# Patient Record
Sex: Male | Born: 1980 | Race: White | Hispanic: No | Marital: Single | Smoking: Current every day smoker
Health system: Southern US, Community
[De-identification: ages and names within clinical notes are randomized; demographics above are authoritative.]

## PROBLEM LIST (undated history)

## (undated) DIAGNOSIS — Z79891 Long term (current) use of opiate analgesic: Secondary | ICD-10-CM

## (undated) DIAGNOSIS — F1010 Alcohol abuse, uncomplicated: Secondary | ICD-10-CM

## (undated) DIAGNOSIS — F1910 Other psychoactive substance abuse, uncomplicated: Secondary | ICD-10-CM

## (undated) DIAGNOSIS — Z5181 Encounter for therapeutic drug level monitoring: Secondary | ICD-10-CM

## (undated) DIAGNOSIS — F1111 Opioid abuse, in remission: Secondary | ICD-10-CM

## (undated) HISTORY — PX: APPENDECTOMY: SHX54

## (undated) HISTORY — PX: CERVICAL FUSION: SHX112

---

## 2004-09-24 ENCOUNTER — Emergency Department: Payer: Self-pay | Admitting: Unknown Physician Specialty

## 2004-10-13 ENCOUNTER — Emergency Department: Payer: Self-pay | Admitting: Emergency Medicine

## 2005-01-31 ENCOUNTER — Observation Stay: Payer: Self-pay | Admitting: General Surgery

## 2005-05-06 ENCOUNTER — Emergency Department: Payer: Self-pay | Admitting: Emergency Medicine

## 2005-06-13 ENCOUNTER — Emergency Department: Payer: Self-pay | Admitting: Emergency Medicine

## 2005-06-18 ENCOUNTER — Emergency Department: Payer: Self-pay | Admitting: Emergency Medicine

## 2005-06-18 IMAGING — CR RIGHT ANKLE - COMPLETE 3+ VIEW
1 series · 5 of 5 positions shown · non-contrast
Comparison: none

REASON FOR EXAM: Twisted ankle while running
COMMENTS:

PROCEDURE:     DXR - DXR ANKLE RIGHT COMPLETE  - [DATE]  [DATE]
RESULT:          Multiple views reveals no fractures or dislocations.  The
ankle mortise is intact.

[Series 1: view not recorded · 0.17mm/px · 5 of 5 slices shown]
[im 1/5]
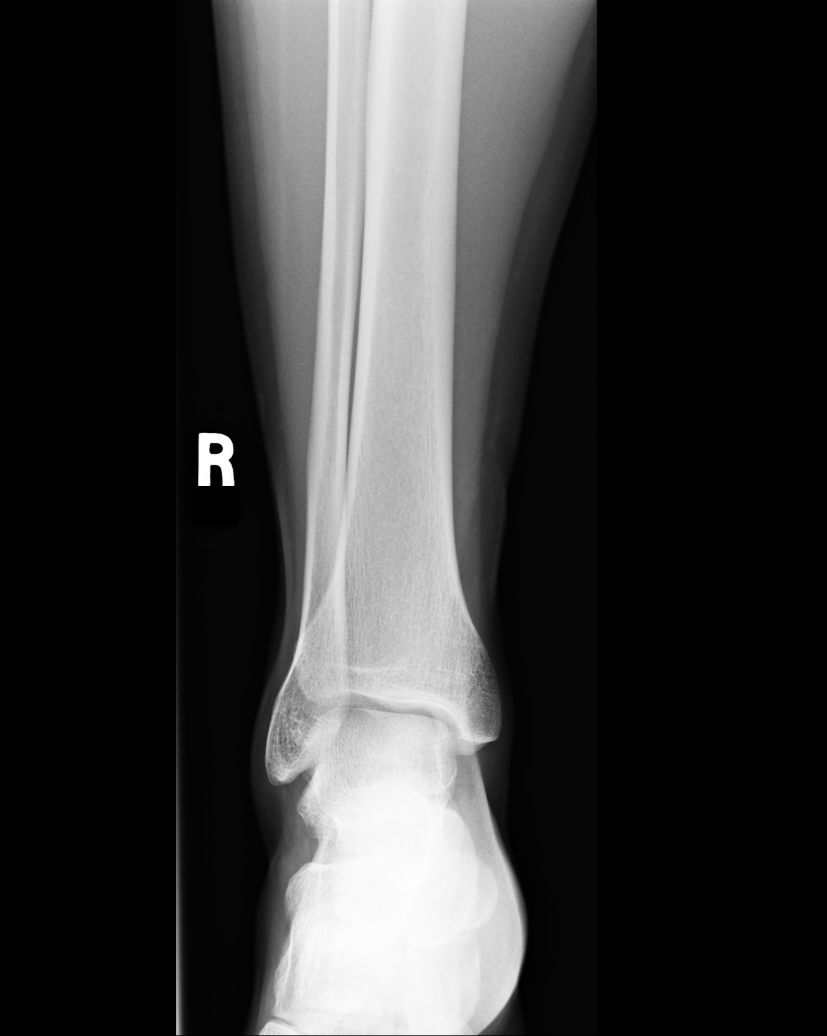
[im 2/5]
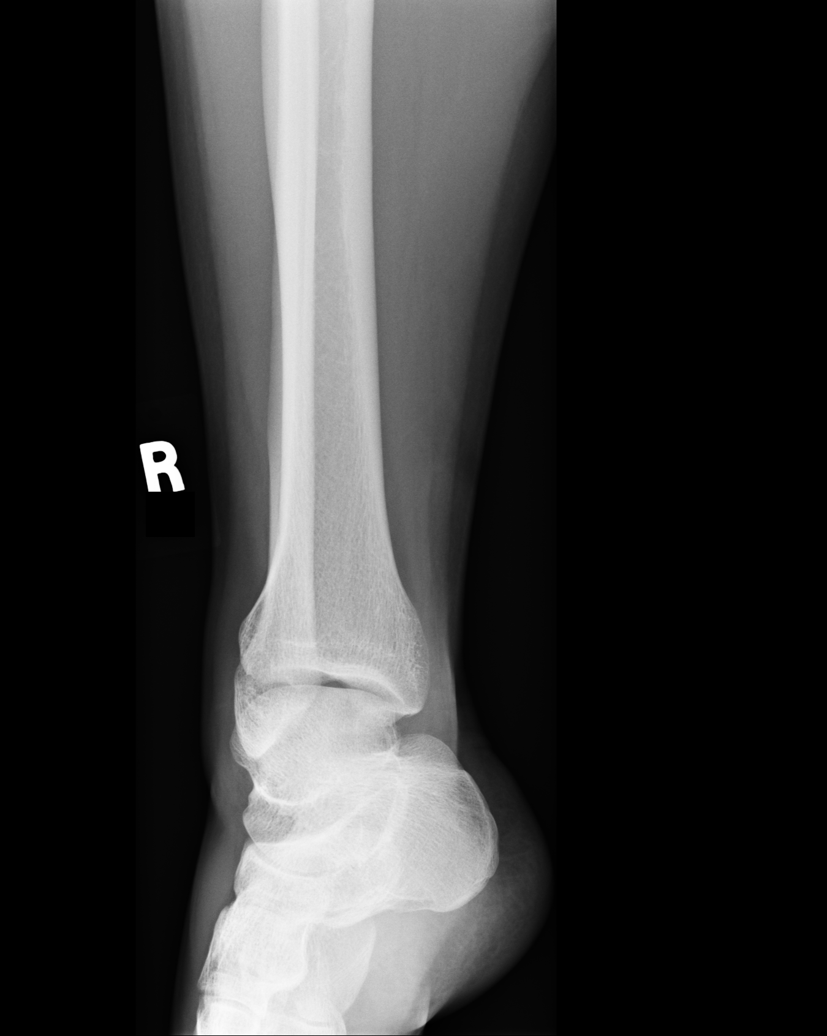
[im 3/5]
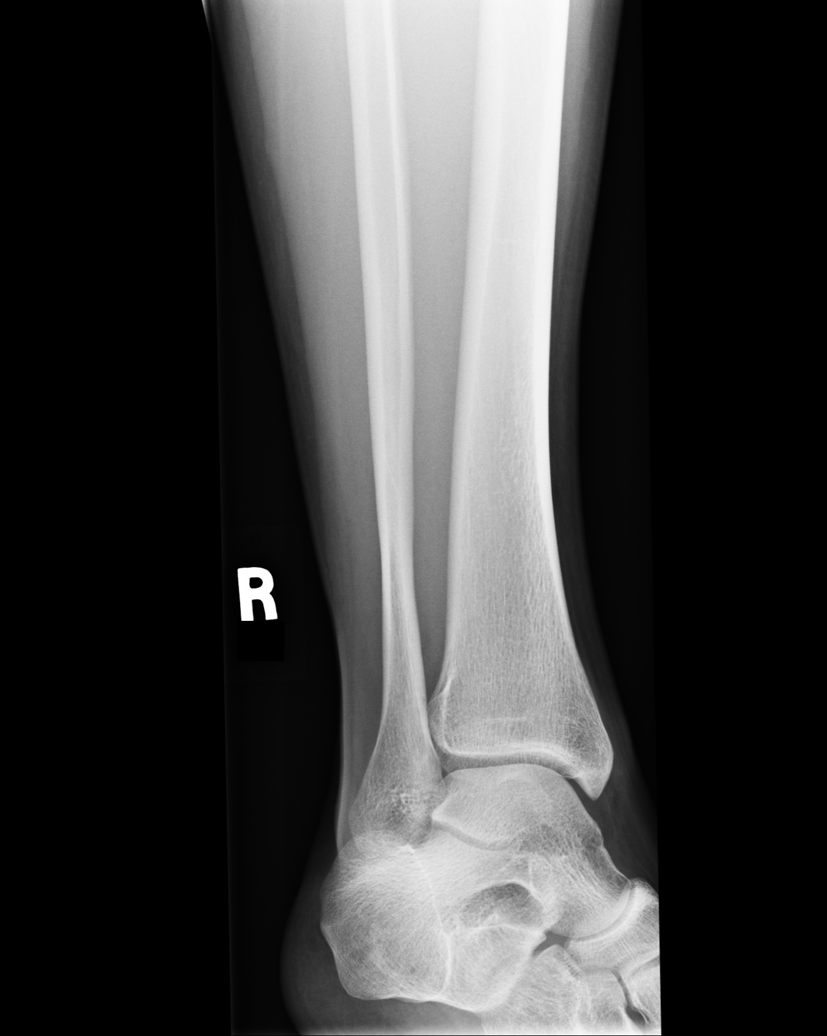
[im 4/5]
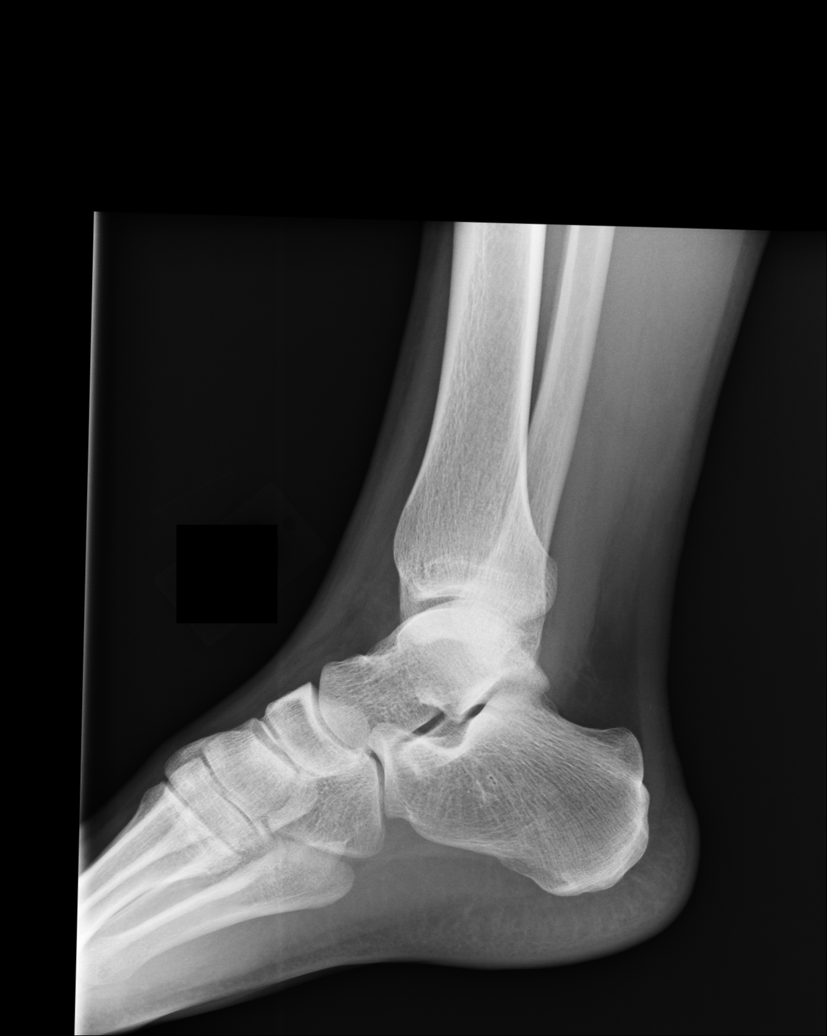
[im 5/5]
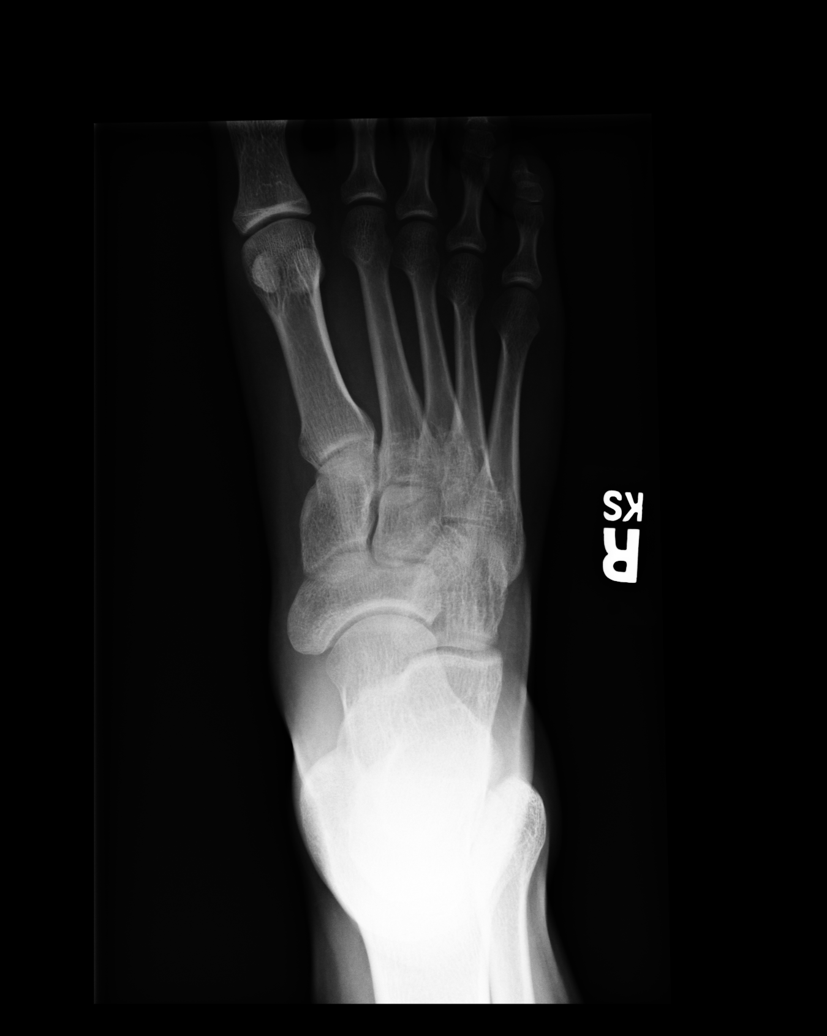

[5 of 5 positions shown; findings below may reference images not displayed]

IMPRESSION: No acute fracture is seen of the RIGHT ankle.

## 2005-06-20 ENCOUNTER — Emergency Department: Payer: Self-pay | Admitting: Emergency Medicine

## 2005-07-30 ENCOUNTER — Emergency Department: Payer: Self-pay | Admitting: Emergency Medicine

## 2005-08-02 ENCOUNTER — Emergency Department: Payer: Self-pay | Admitting: Emergency Medicine

## 2005-08-02 IMAGING — CR DG LUMBAR SPINE AP/LAT/OBLIQUES W/ FLEX AND EXT
1 series · 5 of 5 positions shown · non-contrast
Comparison: none

REASON FOR EXAM: FALL
COMMENTS:

[Series 1: view not recorded · 0.17mm/px · 5 of 5 slices shown]
[im 1/5]
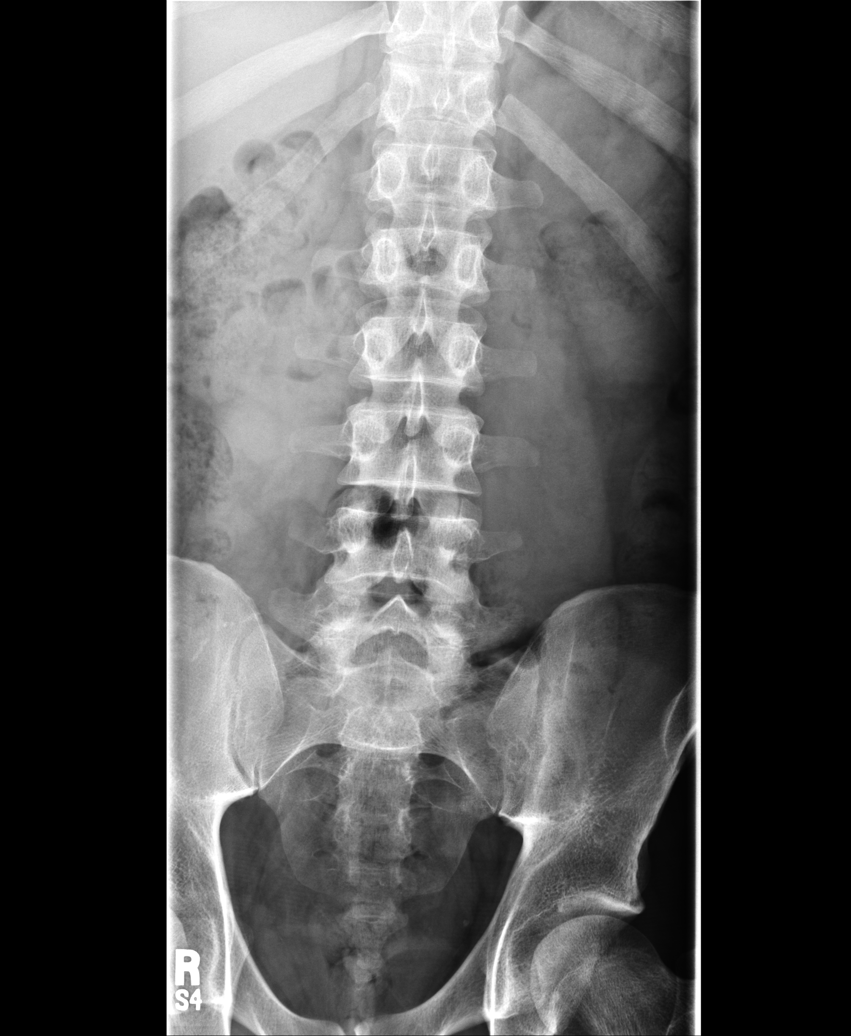
[im 2/5]
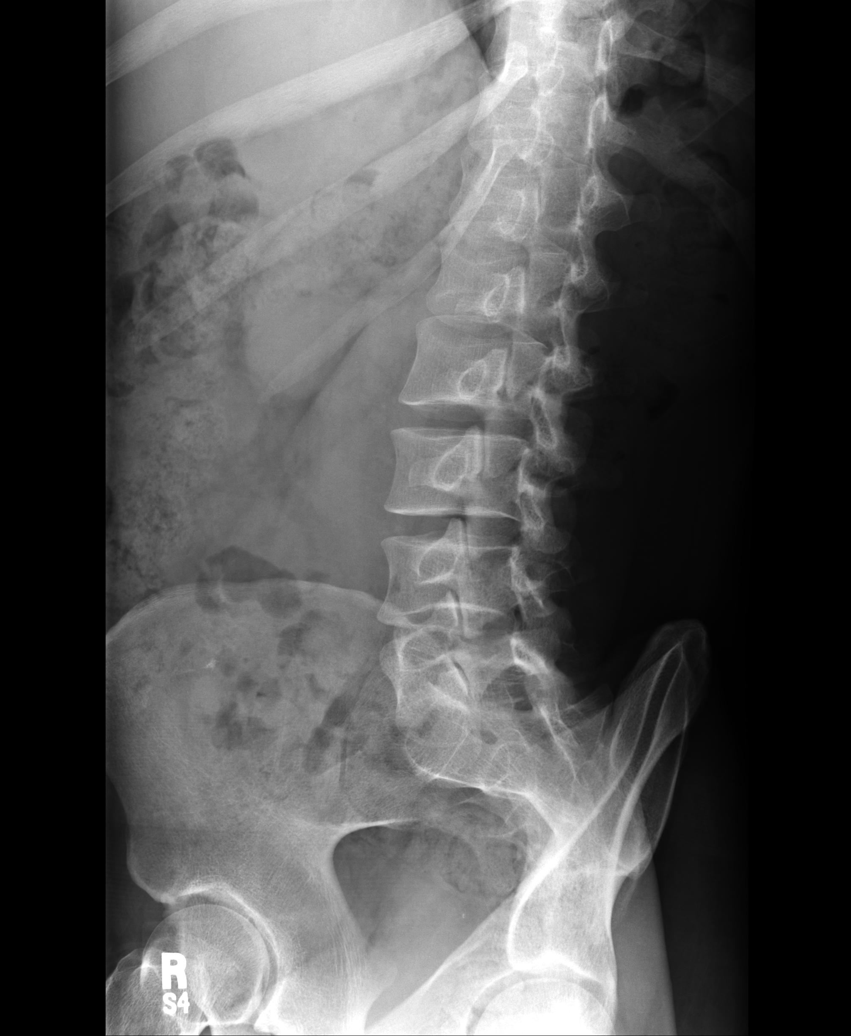
[im 3/5]
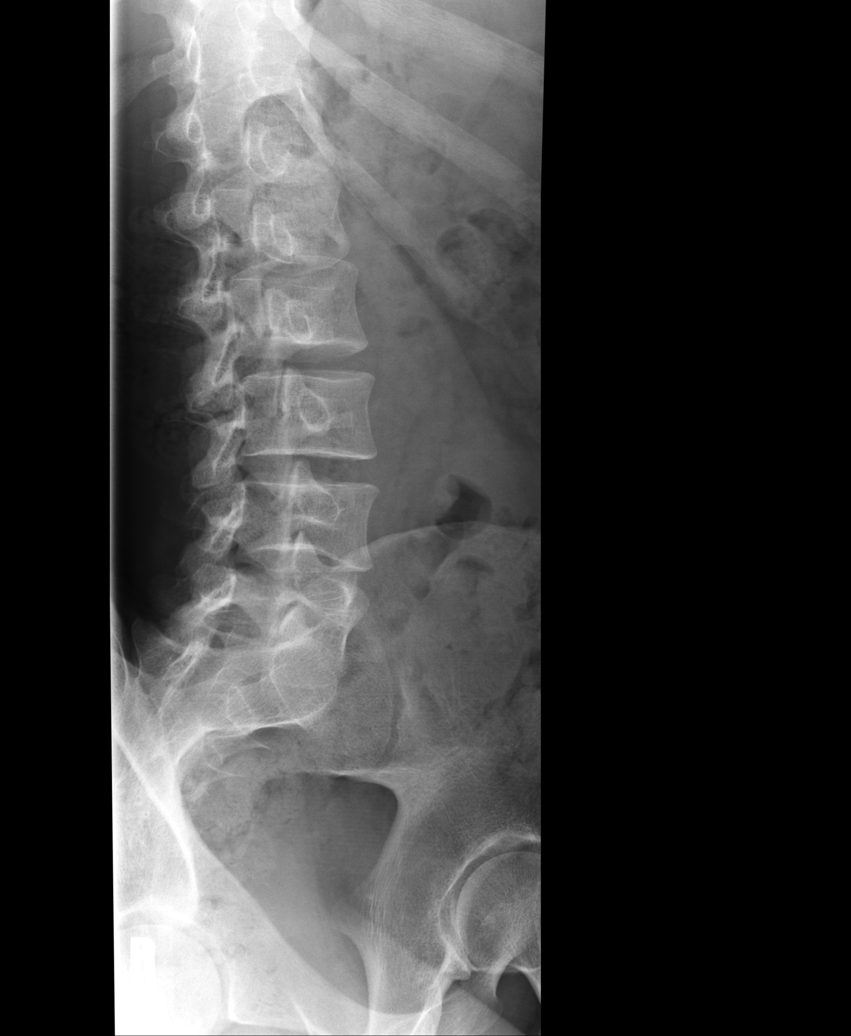
[im 4/5]
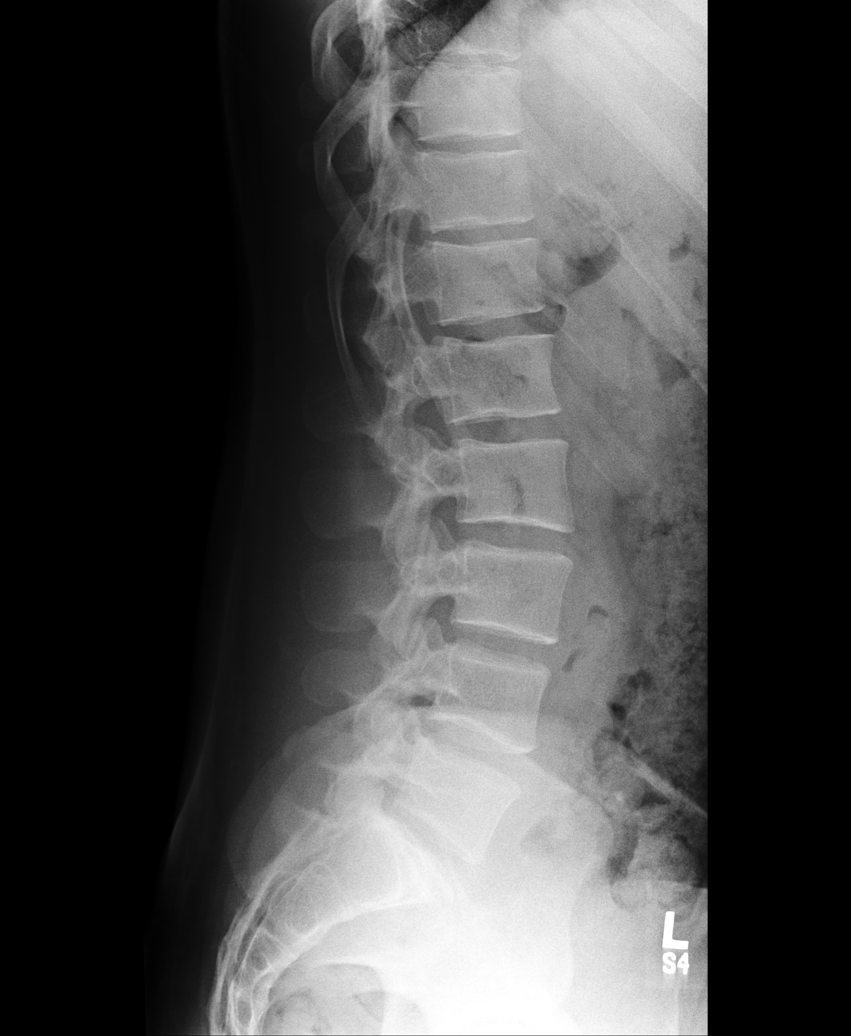
[im 5/5]
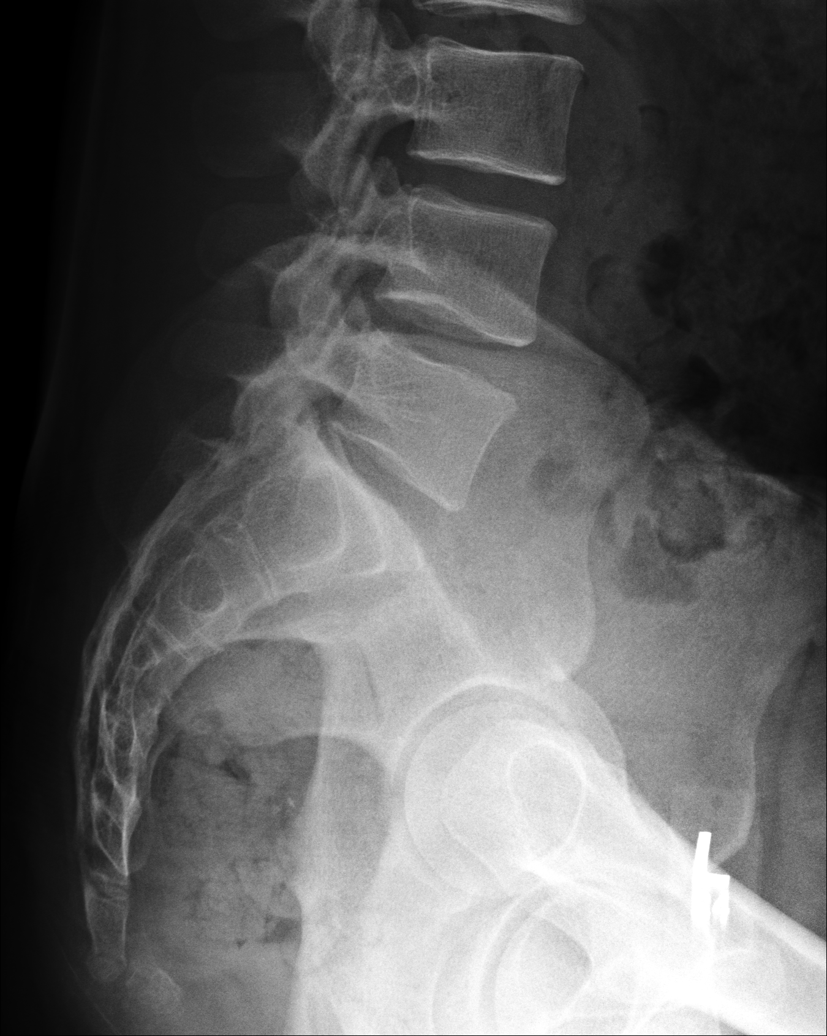

[5 of 5 positions shown; findings below may reference images not displayed]

PROCEDURE:     DXR - DXR LUMBAR SPINE WITH OBLIQUES  - [DATE]  [DATE]

RESULT:     AP, lateral and oblique views of the lumbar spine show the
vertebral body heights and intervertebral disc spaces to be well maintained.
Vertebral body alignment is normal. The pedicles are bilaterally intact.

Incidental note is made that six lumbar vertebrae are visualized consistent
with the absence of a rib at T12 or with lumbarization of S1.
IMPRESSION: No acute changes are identified.

## 2005-08-04 ENCOUNTER — Inpatient Hospital Stay: Payer: Self-pay | Admitting: Internal Medicine

## 2005-08-04 ENCOUNTER — Other Ambulatory Visit: Payer: Self-pay

## 2005-08-05 ENCOUNTER — Inpatient Hospital Stay: Payer: Self-pay

## 2005-11-09 ENCOUNTER — Emergency Department: Payer: Self-pay | Admitting: Emergency Medicine

## 2005-11-09 IMAGING — CR RIGHT HAND - COMPLETE 3+ VIEW
1 series · 3 of 3 positions shown · non-contrast
Comparison: none

REASON FOR EXAM: Hit car with right hand, swollen and deformed
COMMENTS:  LMP: (Male)

PROCEDURE:     DXR - DXR HAND RT COMPLETE W/OBLIQUES  - [DATE]  [DATE]
RESULT:     The bones of the hand are adequately mineralized. I do not see
evidence of an acute fracture. The overlying soft tissues are normal in
appearance.

[Series 1: view not recorded · 0.17mm/px · 3 of 3 slices shown]
[im 1/3]
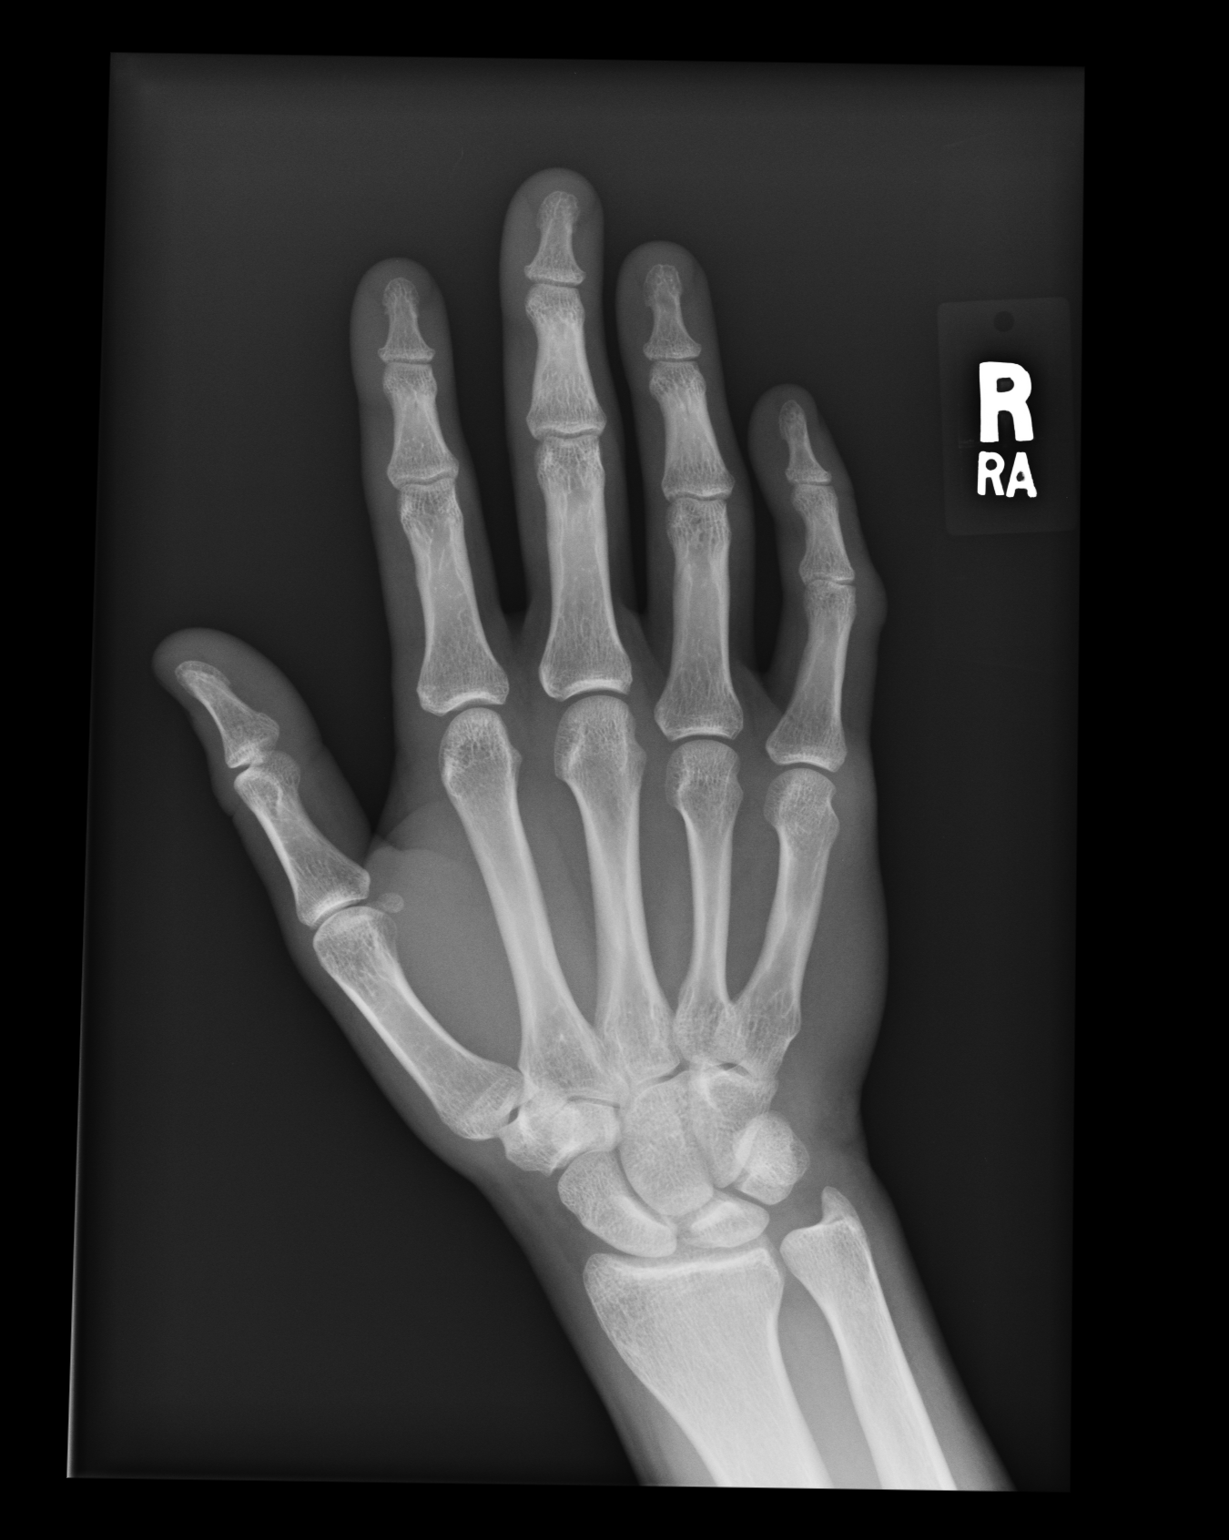
[im 2/3]
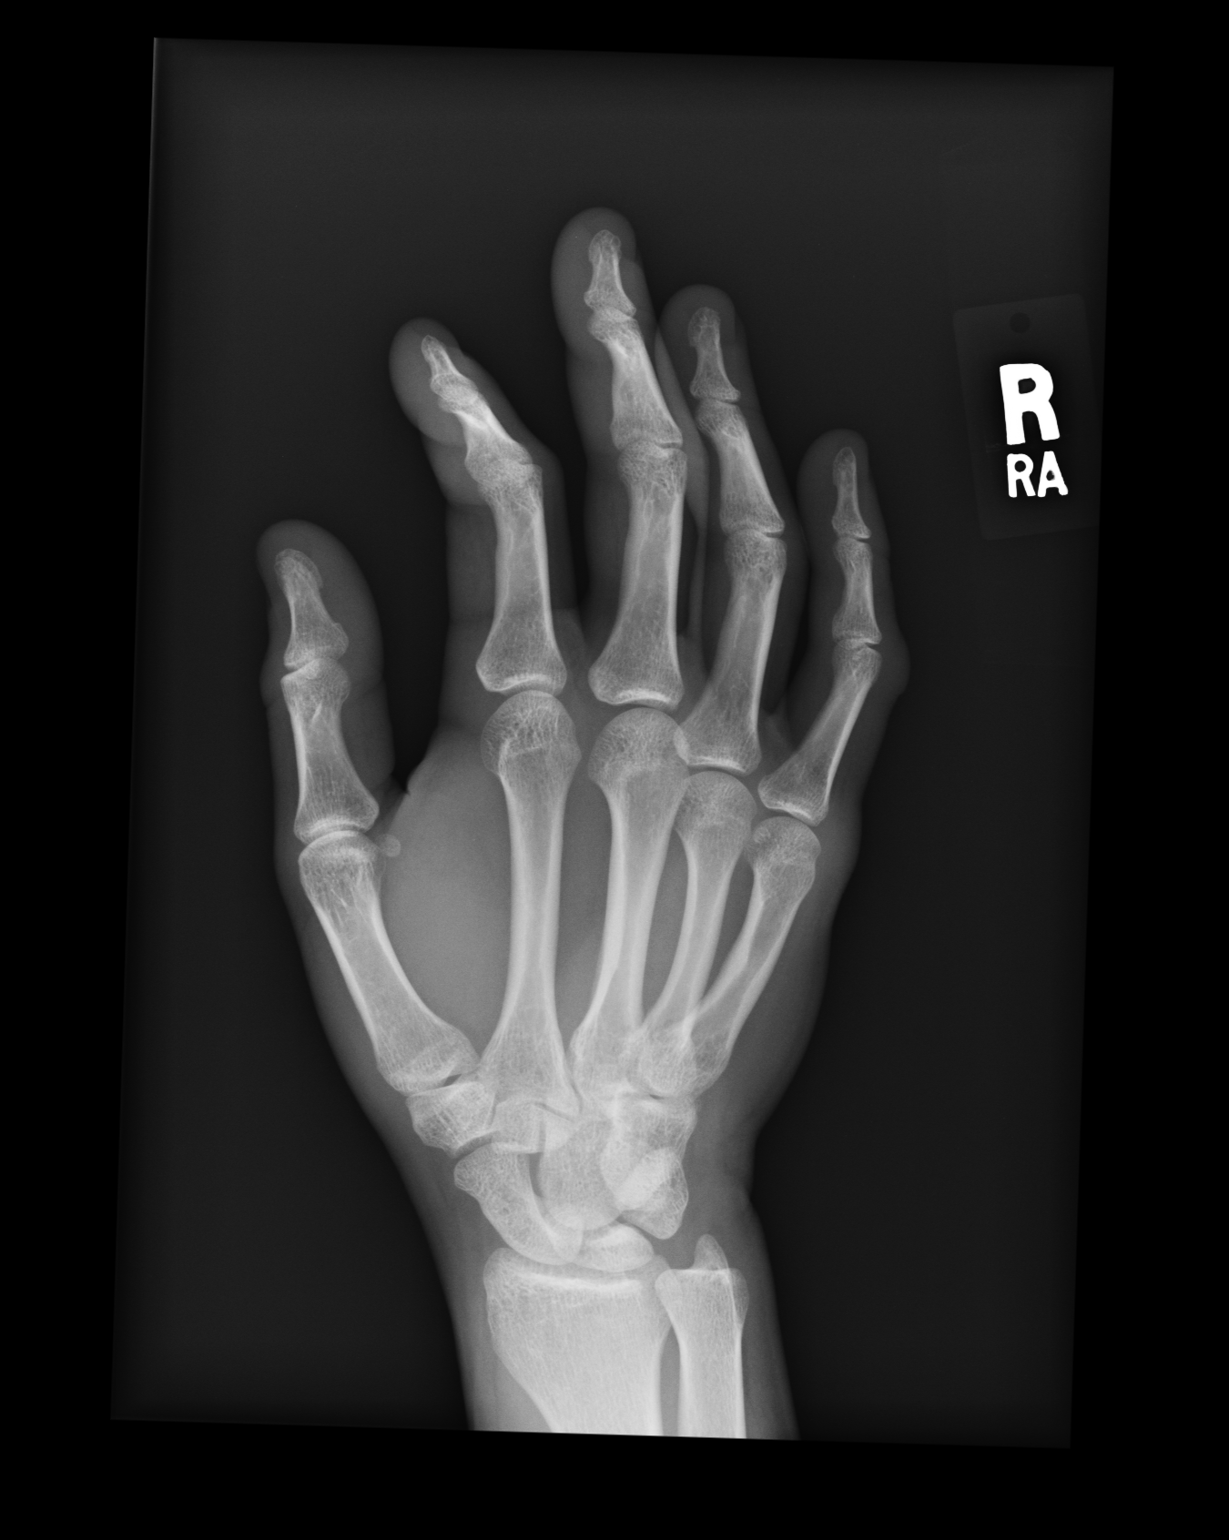
[im 3/3]
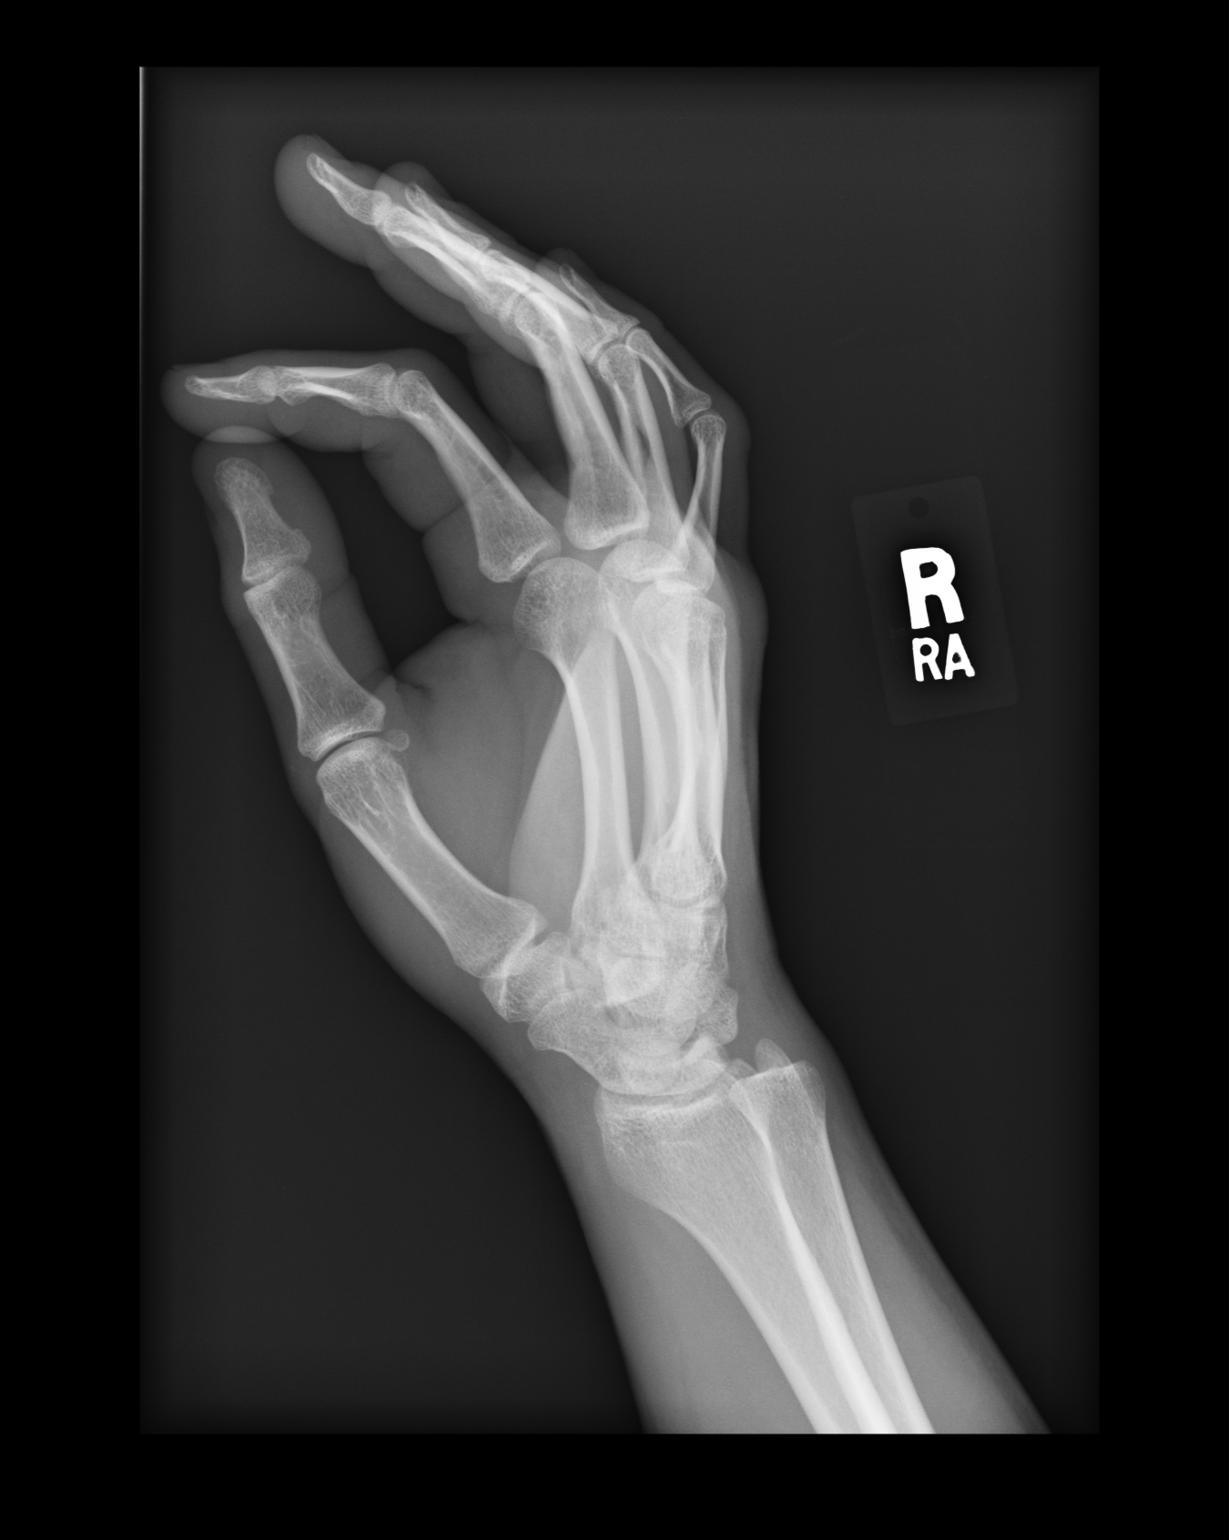

[3 of 3 positions shown; findings below may reference images not displayed]

IMPRESSION: I see no acute fracture of the bones of the RIGHT hand.
Follow-up films are recommended if any area of symptoms persist.

## 2006-02-15 ENCOUNTER — Emergency Department: Payer: Self-pay | Admitting: Unknown Physician Specialty

## 2006-02-20 ENCOUNTER — Emergency Department: Payer: Self-pay | Admitting: Emergency Medicine

## 2007-07-15 ENCOUNTER — Emergency Department: Payer: Self-pay | Admitting: Emergency Medicine

## 2007-09-22 ENCOUNTER — Emergency Department: Payer: Self-pay | Admitting: Emergency Medicine

## 2007-12-09 ENCOUNTER — Emergency Department: Payer: Self-pay | Admitting: Emergency Medicine

## 2007-12-29 ENCOUNTER — Emergency Department: Payer: Self-pay | Admitting: Emergency Medicine

## 2007-12-31 ENCOUNTER — Emergency Department: Payer: Self-pay | Admitting: Emergency Medicine

## 2008-03-25 ENCOUNTER — Emergency Department: Payer: Self-pay | Admitting: Emergency Medicine

## 2008-07-21 ENCOUNTER — Emergency Department: Payer: Self-pay | Admitting: Emergency Medicine

## 2010-04-04 ENCOUNTER — Emergency Department: Payer: Self-pay | Admitting: Emergency Medicine

## 2010-05-31 ENCOUNTER — Inpatient Hospital Stay: Payer: Self-pay | Admitting: Psychiatry

## 2012-04-04 ENCOUNTER — Emergency Department: Payer: Self-pay | Admitting: Emergency Medicine

## 2012-12-09 ENCOUNTER — Emergency Department: Payer: Self-pay | Admitting: Emergency Medicine

## 2012-12-09 LAB — URINALYSIS, COMPLETE
Bacteria: NONE SEEN
Bilirubin,UR: NEGATIVE
Blood: NEGATIVE
Ketone: NEGATIVE
Leukocyte Esterase: NEGATIVE
Specific Gravity: 1.016 (ref 1.003–1.030)
Squamous Epithelial: NONE SEEN

## 2012-12-09 LAB — COMPREHENSIVE METABOLIC PANEL
Alkaline Phosphatase: 143 U/L — ABNORMAL HIGH (ref 50–136)
BUN: 13 mg/dL (ref 7–18)
Calcium, Total: 9.4 mg/dL (ref 8.5–10.1)
Creatinine: 1.16 mg/dL (ref 0.60–1.30)
EGFR (African American): >60
EGFR (Non-African Amer.): >60
Glucose: 106 mg/dL — ABNORMAL HIGH (ref 65–99)
Potassium: 3.8 mmol/L (ref 3.5–5.1)
Sodium: 141 mmol/L (ref 136–145)
Total Protein: 8.1 g/dL (ref 6.4–8.2)

## 2012-12-09 LAB — CBC
HCT: 44.8 % (ref 40.0–52.0)
HGB: 15.3 g/dL (ref 13.0–18.0)
MCV: 89 fL (ref 80–100)
Platelet: 293 10*3/uL (ref 150–440)
RBC: 5.04 10*6/uL (ref 4.40–5.90)
RDW: 13.7 % (ref 11.5–14.5)
WBC: 13.7 10*3/uL — ABNORMAL HIGH (ref 3.8–10.6)

## 2013-02-23 ENCOUNTER — Emergency Department: Payer: Self-pay | Admitting: Emergency Medicine

## 2013-07-07 ENCOUNTER — Emergency Department: Payer: Self-pay | Admitting: Emergency Medicine

## 2013-07-07 LAB — COMPREHENSIVE METABOLIC PANEL
ALK PHOS: 99 U/L
ALT: 22 U/L (ref 12–78)
Albumin: 3.7 g/dL (ref 3.4–5.0)
Anion Gap: 9 (ref 7–16)
BUN: 11 mg/dL (ref 7–18)
Bilirubin,Total: 0.4 mg/dL (ref 0.2–1.0)
CALCIUM: 8.6 mg/dL (ref 8.5–10.1)
CO2: 21 mmol/L (ref 21–32)
Chloride: 105 mmol/L (ref 98–107)
Creatinine: 0.99 mg/dL (ref 0.60–1.30)
EGFR (African American): >60
EGFR (Non-African Amer.): >60
Glucose: 81 mg/dL (ref 65–99)
Osmolality: 269 (ref 275–301)
Potassium: 3.7 mmol/L (ref 3.5–5.1)
SGOT(AST): 39 U/L — ABNORMAL HIGH (ref 15–37)
SODIUM: 135 mmol/L — AB (ref 136–145)
Total Protein: 7.9 g/dL (ref 6.4–8.2)

## 2013-07-07 LAB — CBC
HCT: 41.3 % (ref 40.0–52.0)
HGB: 13.8 g/dL (ref 13.0–18.0)
MCH: 30.1 pg (ref 26.0–34.0)
MCHC: 33.3 g/dL (ref 32.0–36.0)
MCV: 91 fL (ref 80–100)
PLATELETS: 257 10*3/uL (ref 150–440)
RBC: 4.57 10*6/uL (ref 4.40–5.90)
RDW: 13.9 % (ref 11.5–14.5)
WBC: 8.2 10*3/uL (ref 3.8–10.6)

## 2013-07-07 LAB — ETHANOL
ETHANOL %: 0.008 % (ref 0.000–0.080)
Ethanol %: 0.181 % — ABNORMAL HIGH (ref 0.000–0.080)
Ethanol: 181 mg/dL
Ethanol: 8 mg/dL

## 2013-07-07 LAB — URINALYSIS, COMPLETE
BACTERIA: NONE SEEN
BILIRUBIN, UR: NEGATIVE
Blood: NEGATIVE
GLUCOSE, UR: NEGATIVE mg/dL (ref 0–75)
Ketone: NEGATIVE
Leukocyte Esterase: NEGATIVE
Nitrite: NEGATIVE
Ph: 5 (ref 4.5–8.0)
Protein: NEGATIVE
RBC, UR: NONE SEEN /HPF (ref 0–5)
SQUAMOUS EPITHELIAL: NONE SEEN
Specific Gravity: 1.004 (ref 1.003–1.030)
WBC UR: NONE SEEN /HPF (ref 0–5)

## 2013-07-07 LAB — DRUG SCREEN, URINE
Amphetamines, Ur Screen: NEGATIVE (ref ?–1000)
BENZODIAZEPINE, UR SCRN: NEGATIVE (ref ?–200)
Barbiturates, Ur Screen: NEGATIVE (ref ?–200)
COCAINE METABOLITE, UR ~~LOC~~: POSITIVE (ref ?–300)
Cannabinoid 50 Ng, Ur ~~LOC~~: POSITIVE (ref ?–50)
MDMA (Ecstasy)Ur Screen: NEGATIVE (ref ?–500)
METHADONE, UR SCREEN: NEGATIVE (ref ?–300)
Opiate, Ur Screen: NEGATIVE (ref ?–300)
PHENCYCLIDINE (PCP) UR S: NEGATIVE (ref ?–25)
TRICYCLIC, UR SCREEN: NEGATIVE (ref ?–1000)

## 2013-07-07 LAB — TROPONIN I
Troponin-I: <0.02 ng/mL
Troponin-I: <0.02 ng/mL

## 2013-07-07 LAB — SALICYLATE LEVEL: SALICYLATES, SERUM: 3.5 mg/dL — AB

## 2013-07-07 LAB — ACETAMINOPHEN LEVEL: Acetaminophen: <2 ug/mL

## 2013-07-07 IMAGING — CR DG CHEST 2V
1 series · 2 of 2 positions shown · non-contrast
Comparison: None available for comparison at time of study
interpretation.

CLINICAL DATA: Shortness of breath, sternal chest pain, lethargy.

EXAM:
CHEST  2 VIEW

[Series 1: w chest pa · 0.14mm/px · 2 of 2 slices shown]
[im 1/2]
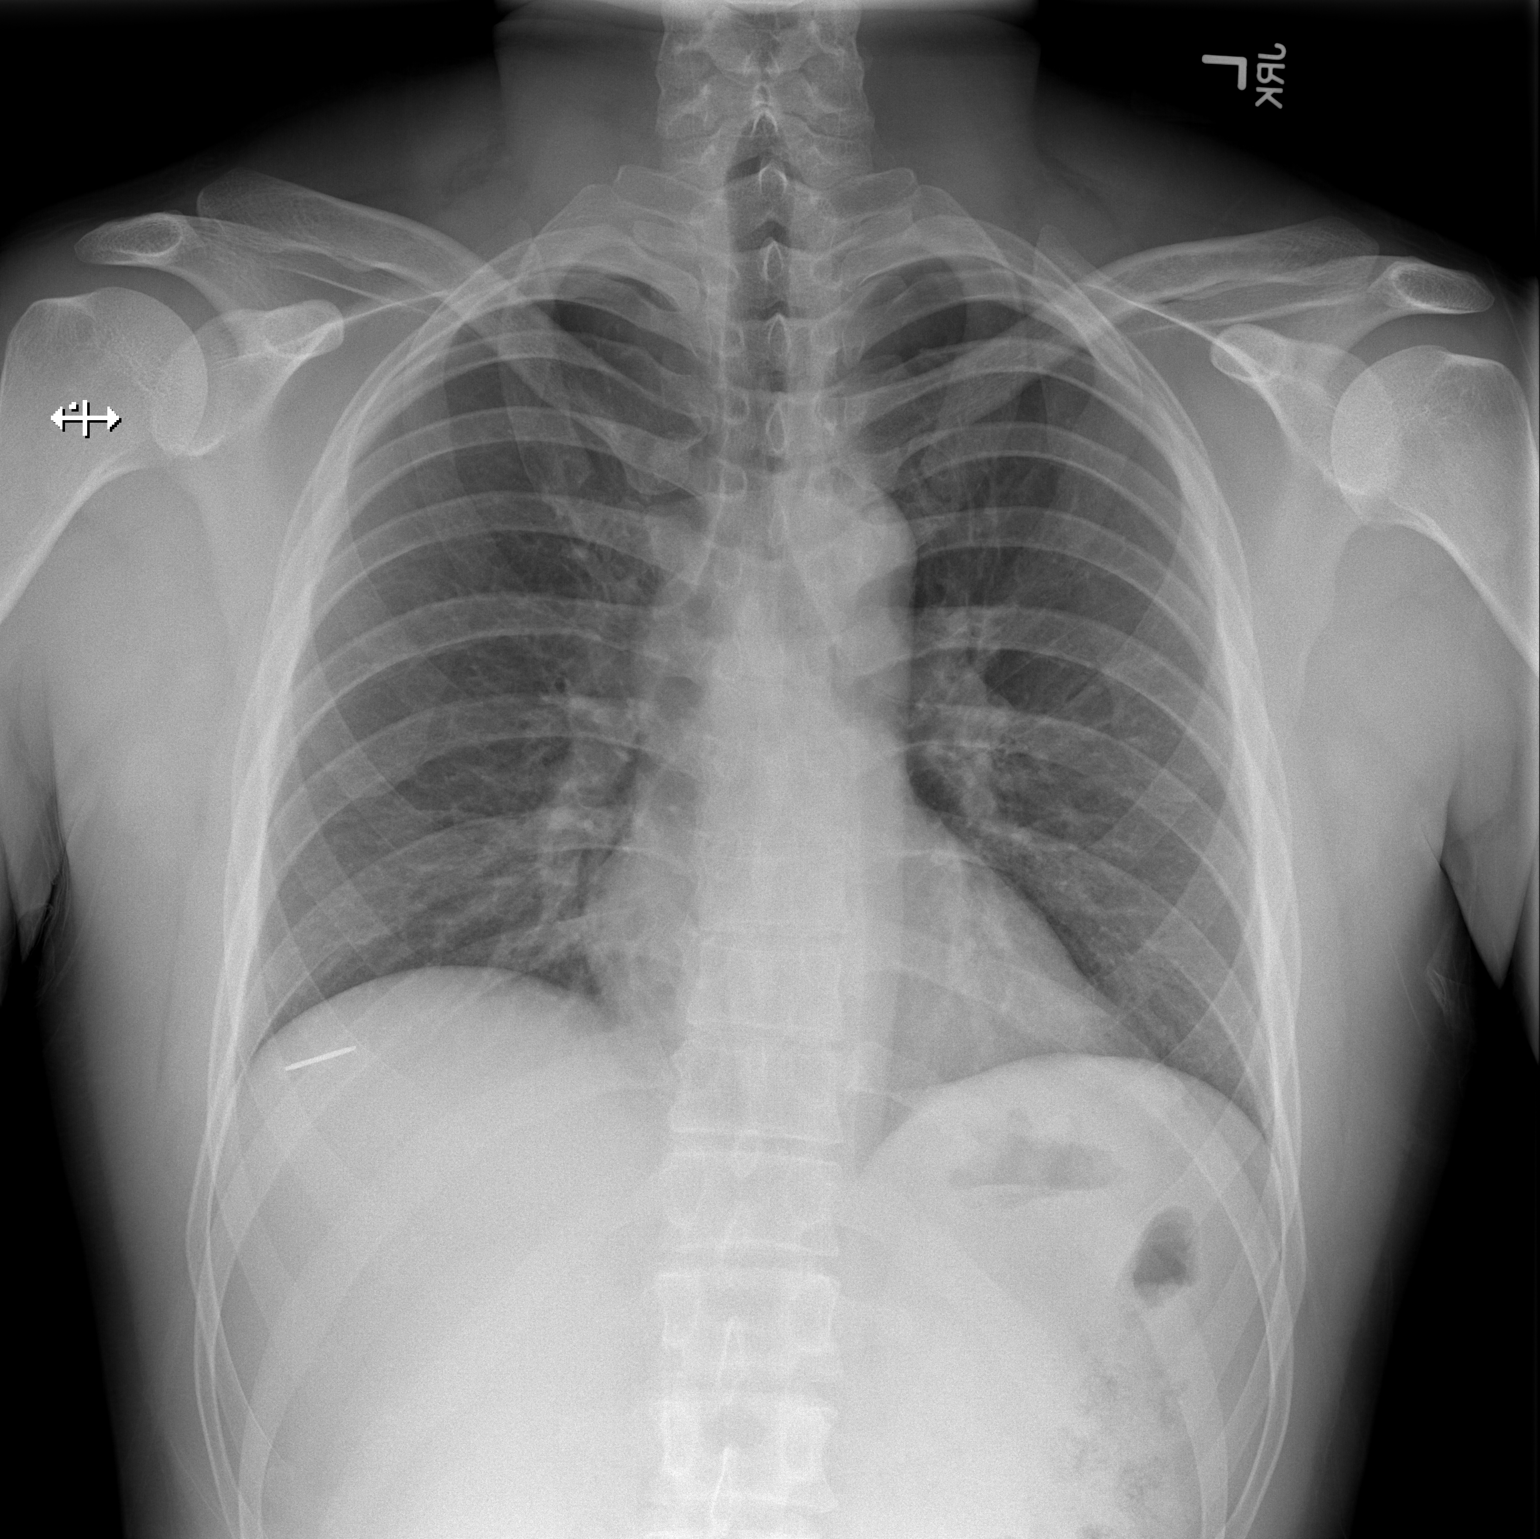
[im 2/2]
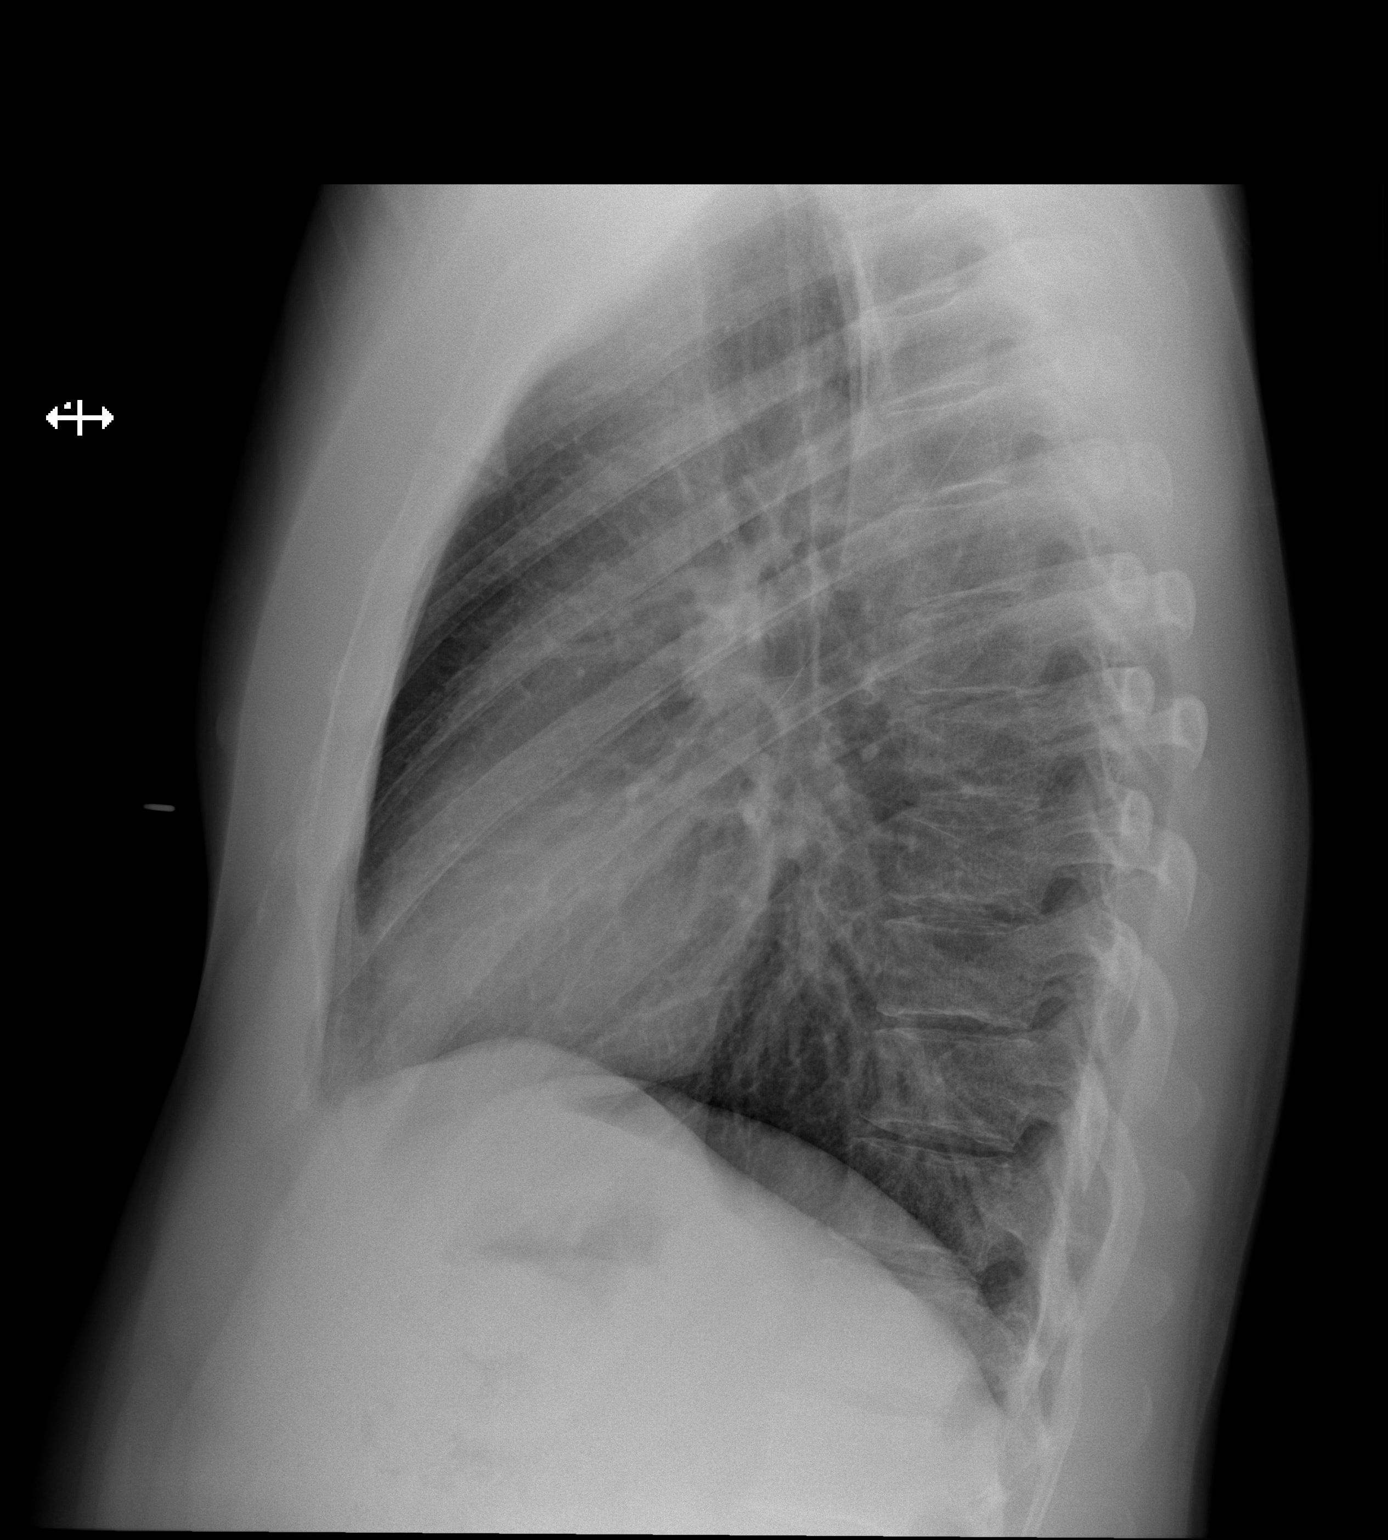

[2 of 2 positions shown; findings below may reference images not displayed]

FINDINGS: Cardiomediastinal silhouette is unremarkable. The lungs are clear
without pleural effusions or focal consolidations. Trachea projects
midline and there is no pneumothorax. Soft tissue planes and
included osseous structures are non-suspicious.
IMPRESSION: No acute cardiopulmonary process: Normal chest radiograph.

  By: HERCULES

## 2013-10-02 ENCOUNTER — Emergency Department: Payer: Self-pay | Admitting: Emergency Medicine

## 2014-01-25 ENCOUNTER — Emergency Department: Payer: Self-pay | Admitting: Emergency Medicine

## 2014-01-26 LAB — URINALYSIS, COMPLETE
BILIRUBIN, UR: NEGATIVE
Bacteria: NONE SEEN
Blood: NEGATIVE
Glucose,UR: NEGATIVE mg/dL (ref 0–75)
Ketone: NEGATIVE
LEUKOCYTE ESTERASE: NEGATIVE
Nitrite: NEGATIVE
PROTEIN: NEGATIVE
Ph: 6 (ref 4.5–8.0)
RBC,UR: NONE SEEN /HPF (ref 0–5)
SPECIFIC GRAVITY: 1.001 (ref 1.003–1.030)
SQUAMOUS EPITHELIAL: NONE SEEN

## 2014-01-26 LAB — COMPREHENSIVE METABOLIC PANEL
ALBUMIN: 3.7 g/dL (ref 3.4–5.0)
ALK PHOS: 109 U/L
ALT: 31 U/L
ANION GAP: 8 (ref 7–16)
AST: 29 U/L (ref 15–37)
BILIRUBIN TOTAL: 0.2 mg/dL (ref 0.2–1.0)
BUN: 7 mg/dL (ref 7–18)
CHLORIDE: 106 mmol/L (ref 98–107)
CO2: 28 mmol/L (ref 21–32)
Calcium, Total: 8.9 mg/dL (ref 8.5–10.1)
Creatinine: 1.08 mg/dL (ref 0.60–1.30)
EGFR (African American): >60
EGFR (Non-African Amer.): >60
GLUCOSE: 85 mg/dL (ref 65–99)
OSMOLALITY: 280 (ref 275–301)
Potassium: 3.4 mmol/L — ABNORMAL LOW (ref 3.5–5.1)
SODIUM: 142 mmol/L (ref 136–145)
Total Protein: 8.2 g/dL (ref 6.4–8.2)

## 2014-01-26 LAB — CBC
HCT: 46.5 % (ref 40.0–52.0)
HGB: 15.4 g/dL (ref 13.0–18.0)
MCH: 30 pg (ref 26.0–34.0)
MCHC: 33.2 g/dL (ref 32.0–36.0)
MCV: 91 fL (ref 80–100)
PLATELETS: 289 10*3/uL (ref 150–440)
RBC: 5.14 10*6/uL (ref 4.40–5.90)
RDW: 13.4 % (ref 11.5–14.5)
WBC: 7.6 10*3/uL (ref 3.8–10.6)

## 2014-01-26 LAB — DRUG SCREEN, URINE
AMPHETAMINES, UR SCREEN: NEGATIVE (ref ?–1000)
Barbiturates, Ur Screen: NEGATIVE (ref ?–200)
Benzodiazepine, Ur Scrn: NEGATIVE (ref ?–200)
CANNABINOID 50 NG, UR ~~LOC~~: NEGATIVE (ref ?–50)
Cocaine Metabolite,Ur ~~LOC~~: POSITIVE (ref ?–300)
MDMA (Ecstasy)Ur Screen: NEGATIVE (ref ?–500)
METHADONE, UR SCREEN: NEGATIVE (ref ?–300)
OPIATE, UR SCREEN: NEGATIVE (ref ?–300)
PHENCYCLIDINE (PCP) UR S: NEGATIVE (ref ?–25)
TRICYCLIC, UR SCREEN: NEGATIVE (ref ?–1000)

## 2014-01-26 LAB — ETHANOL
ETHANOL %: 0.125 % — AB (ref 0.000–0.080)
Ethanol: 125 mg/dL

## 2014-01-26 LAB — SALICYLATE LEVEL: Salicylates, Serum: 6.4 mg/dL — ABNORMAL HIGH

## 2014-01-26 LAB — ACETAMINOPHEN LEVEL: Acetaminophen: <2 ug/mL

## 2015-07-26 ENCOUNTER — Emergency Department (HOSPITAL_COMMUNITY)

## 2015-07-26 ENCOUNTER — Emergency Department (HOSPITAL_COMMUNITY)
Admission: EM | Admit: 2015-07-26 | Discharge: 2015-07-26 | Disposition: A | Discharge status: Home / Self Care | Attending: Emergency Medicine | Admitting: Emergency Medicine

## 2015-07-26 ENCOUNTER — Encounter (HOSPITAL_COMMUNITY): Payer: Self-pay | Admitting: *Deleted

## 2015-07-26 DIAGNOSIS — W231XXA Caught, crushed, jammed, or pinched between stationary objects, initial encounter: Secondary | ICD-10-CM | POA: Diagnosis not present

## 2015-07-26 DIAGNOSIS — Z87891 Personal history of nicotine dependence: Secondary | ICD-10-CM | POA: Insufficient documentation

## 2015-07-26 DIAGNOSIS — S6991XA Unspecified injury of right wrist, hand and finger(s), initial encounter: Secondary | ICD-10-CM | POA: Diagnosis present

## 2015-07-26 DIAGNOSIS — Y998 Other external cause status: Secondary | ICD-10-CM | POA: Diagnosis not present

## 2015-07-26 DIAGNOSIS — Y9289 Other specified places as the place of occurrence of the external cause: Secondary | ICD-10-CM | POA: Diagnosis not present

## 2015-07-26 DIAGNOSIS — Y93B3 Activity, free weights: Secondary | ICD-10-CM | POA: Insufficient documentation

## 2015-07-26 DIAGNOSIS — S6721XA Crushing injury of right hand, initial encounter: Secondary | ICD-10-CM | POA: Insufficient documentation

## 2015-07-26 IMAGING — DX DG HAND COMPLETE 3+V*R*
3 series · 3 of 3 positions shown · non-contrast
Comparison: None.

CLINICAL DATA: Crush injury to the right hand earlier today when
the hand was squeezed between 2 weights while working out. Initial
encounter. Pain localizing to the 5th metacarpal region.

EXAM:
RIGHT HAND - COMPLETE 3+ VIEW

[hand pa]
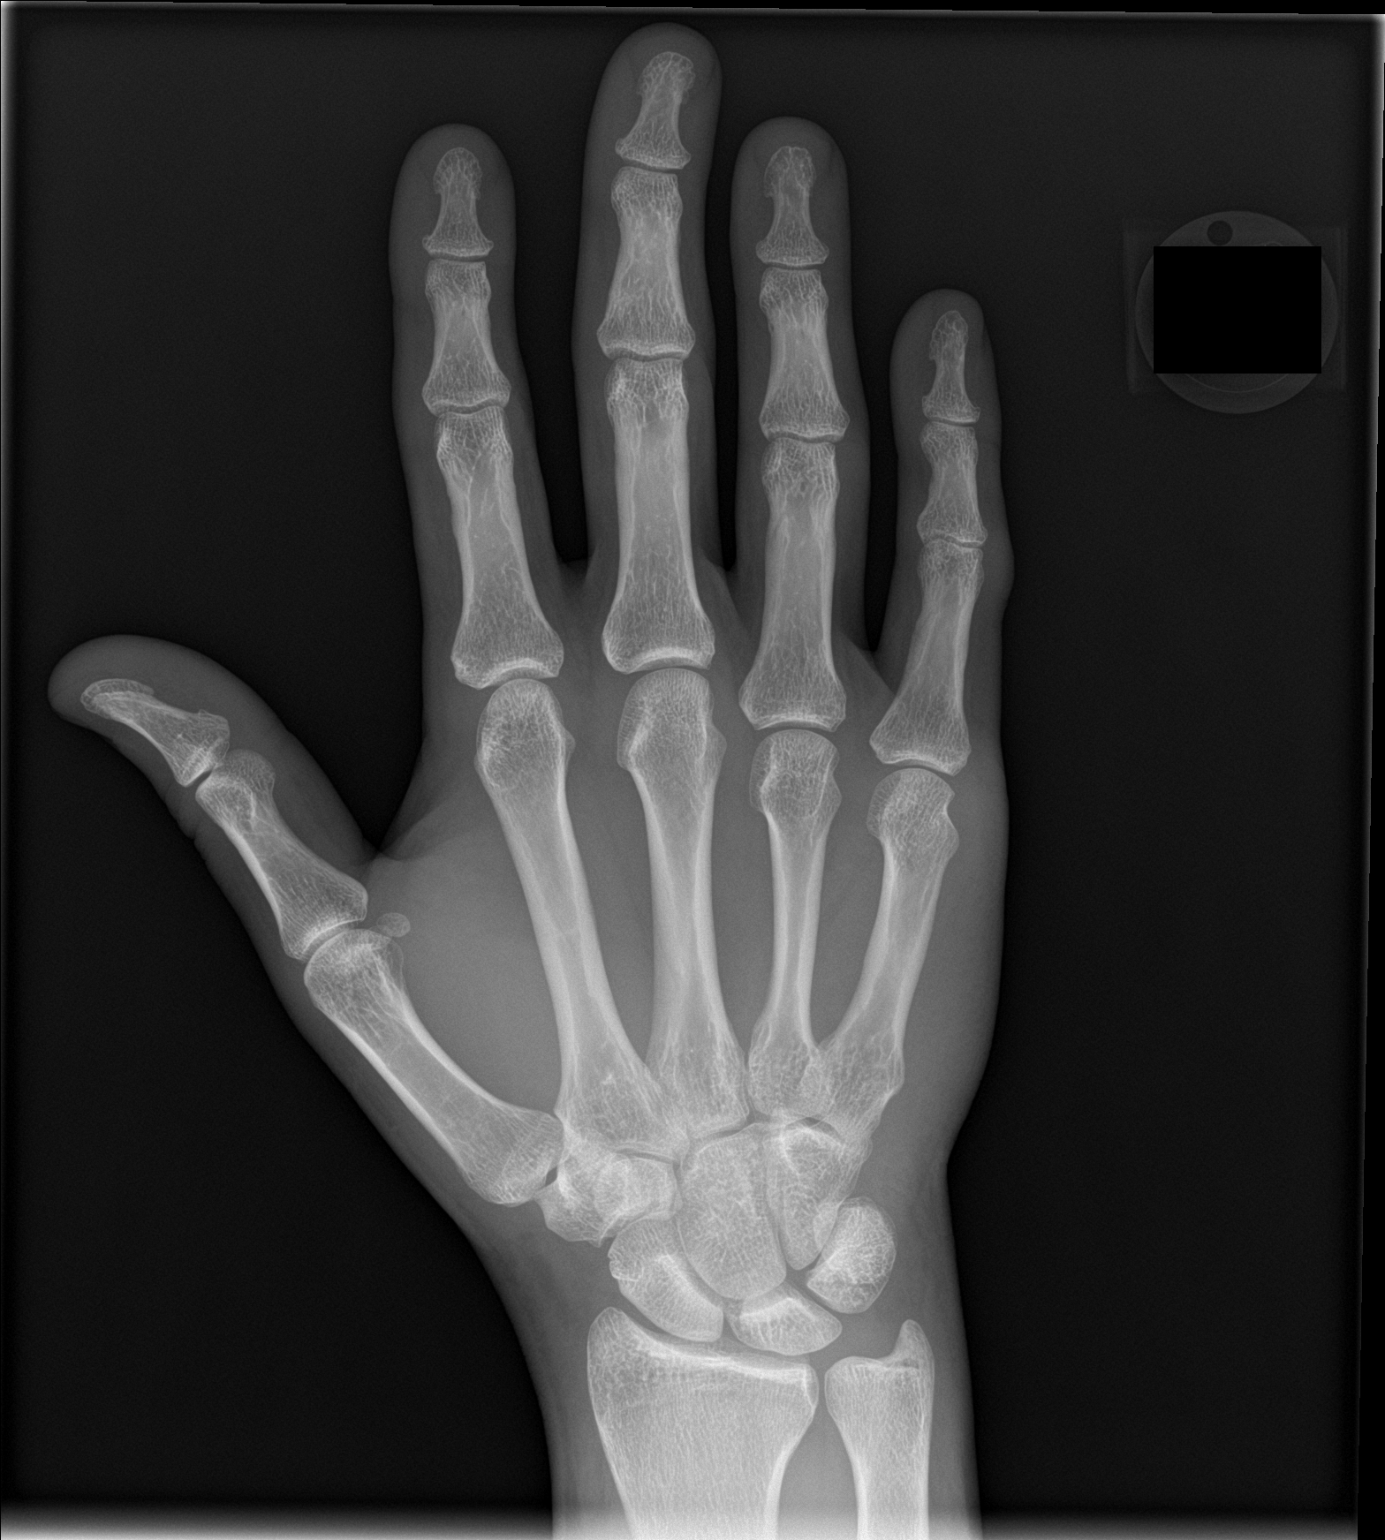

[hand obl]
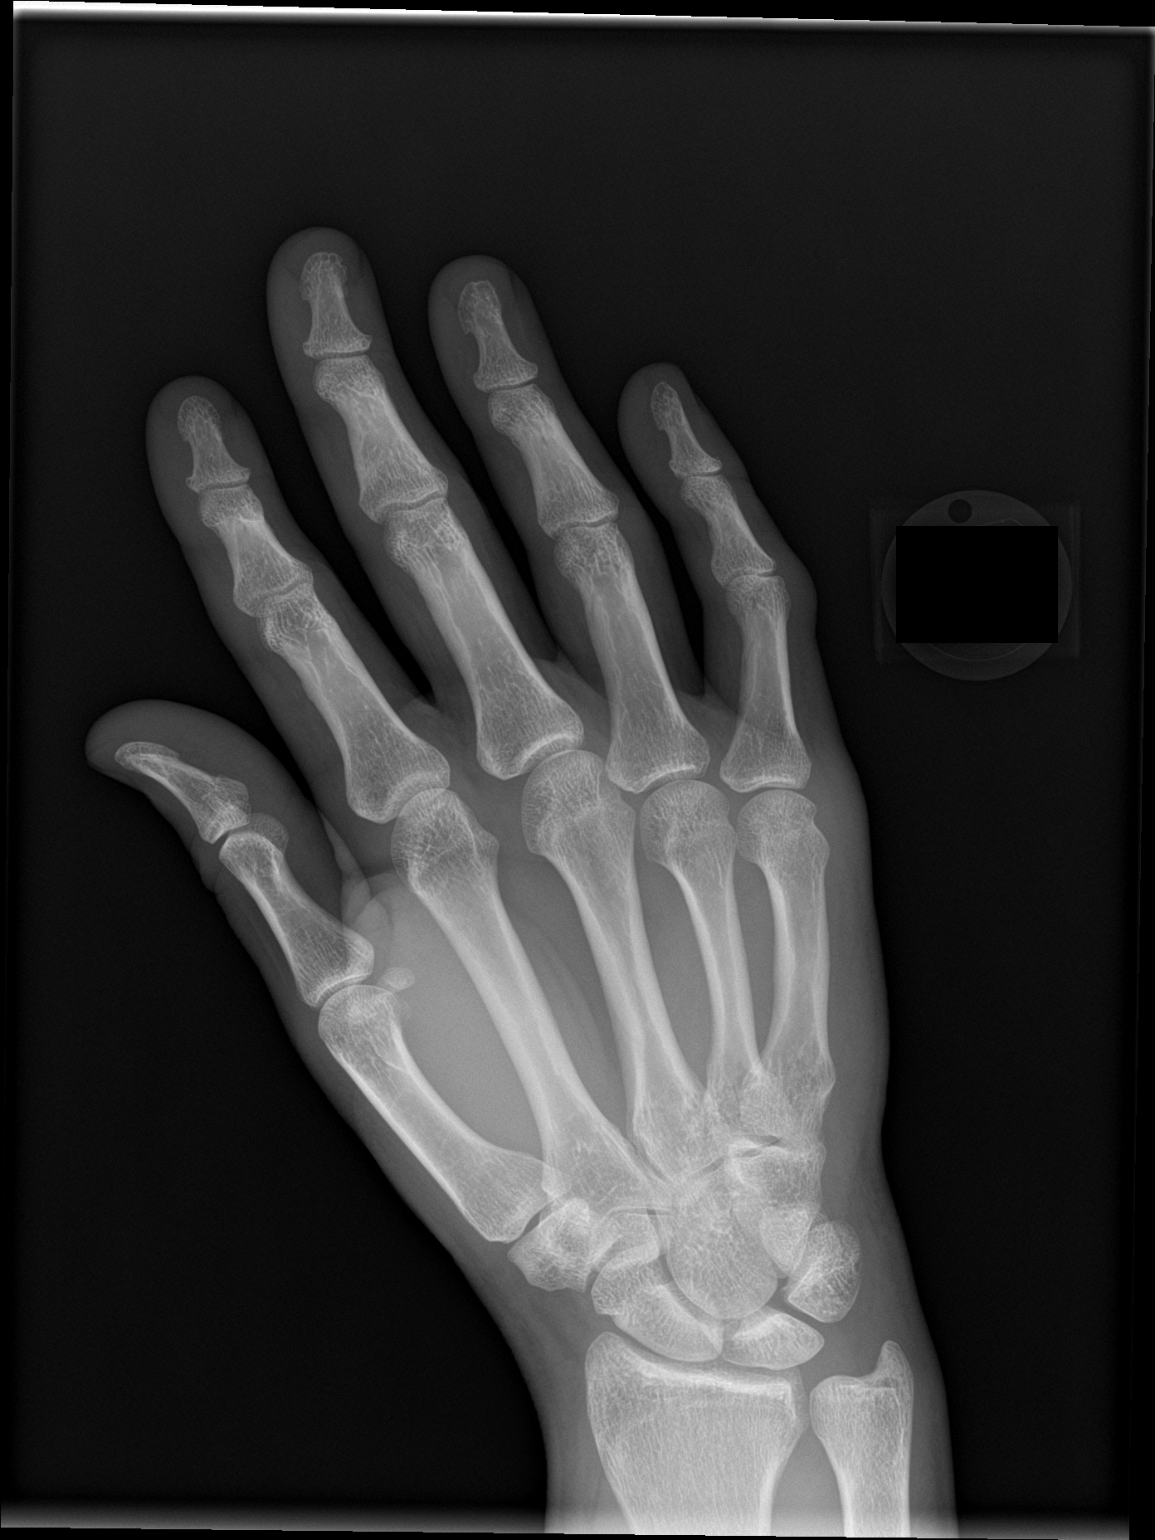

[hand lat]
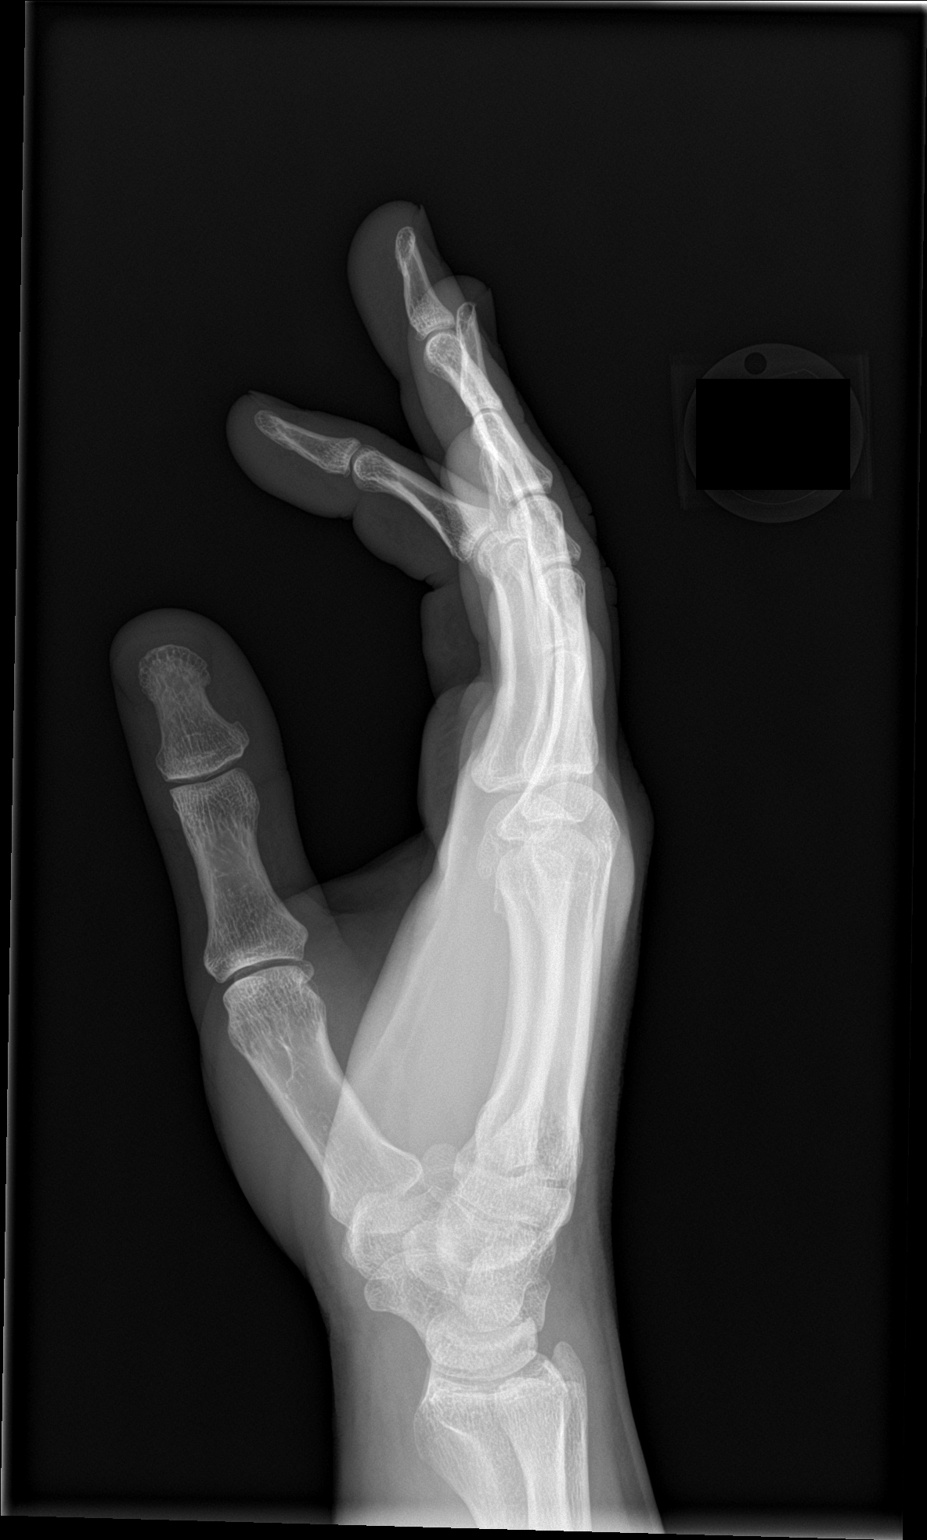

[3 of 3 positions shown; findings below may reference images not displayed]

FINDINGS: Soft tissue swelling overlying the 5th metacarpal. No evidence of
acute fracture or dislocation. Joint spaces well preserved.
Well-preserved bone mineral density. No intrinsic osseous
abnormalities.
IMPRESSION: No osseous abnormality.

## 2015-07-26 MED ORDER — IBUPROFEN 600 MG PO TABS: 600.0000 mg | ORAL_TABLET | Freq: Four times a day (QID) | ORAL | Status: DC | PRN

## 2015-07-26 MED ORDER — TRAMADOL HCL 50 MG PO TABS
50.0000 mg | ORAL_TABLET | Freq: Once | ORAL | Status: AC
Start: 2015-07-26 — End: 2015-07-26
  Administered 2015-07-26: 50 mg via ORAL
  Filled 2015-07-26: qty 1

## 2015-07-26 MED ORDER — IBUPROFEN 800 MG PO TABS: 800.0000 mg | ORAL_TABLET | Freq: Once | ORAL | Status: DC

## 2015-07-26 MED ORDER — IBUPROFEN 800 MG PO TABS
800.0000 mg | ORAL_TABLET | Freq: Once | ORAL | Status: AC
  Administered 2015-07-26: 800 mg via ORAL
  Filled 2015-07-26: qty 1

## 2015-07-26 MED ORDER — TRAMADOL HCL 50 MG PO TABS: 50.0000 mg | ORAL_TABLET | Freq: Four times a day (QID) | ORAL | Status: DC | PRN

## 2015-07-26 NOTE — Discharge Instructions (Signed)
Ice and elevate your hand as much as possible for the next several days.  Wear the padded ace wrap for comfort and compression.  Get rechecked in one week if your symptoms are not starting to improve by then.  Get rechecked sooner if you develop any worsened pain, swelling or severe pain with attempts to flex and extend your fingers or wrist.

## 2015-07-26 NOTE — ED Provider Notes (Signed)
CSN: [id number]     Arrival date & time 07/26/15  1459 History  By signing my name below, I, Evon Slack, attest that this documentation has been prepared under the direction and in the presence of [employer], PA-C. Electronically Signed: Evon Slack, ED Scribe. 07/26/2015. 4:04 PM.    Chief Complaint  Patient presents with  . Hand Injury   The history is provided by the patient. No language interpreter was used.   HPI Comments: Adam Donovan is a 35 y.o. male who presents to the Emergency Department complaining of right hand crush injury onset 3 hours PTA. Pt states that he crushed his hand under a 45 pound dumbbell while working out. Pt states that he has associated swelling along with decreased sensation in his finger tips which is better than after the initial injury. Pt states that he is right hand dominant. Pt doesn't report any medications PTA.   History reviewed. No pertinent past medical history. Past Surgical History  Procedure Laterality Date  . Appendectomy     No family history on file. Social History  Substance Use Topics  . Smoking status: Former Games developer  . Smokeless tobacco: None  . Alcohol Use: No    Review of Systems  Musculoskeletal: Positive for joint swelling and arthralgias.  Neurological: Negative for numbness.    Allergies  Review of patient's allergies indicates no known allergies.  Home Medications   Prior to Admission medications   Medication Sig Start Date End Date Taking? Authorizing Provider  ibuprofen (ADVIL,MOTRIN) 600 MG tablet Take 1 tablet (600 mg total) by mouth every 6 (six) hours as needed. 07/26/15   Burgess Amor, PA-C  traMADol (ULTRAM) 50 MG tablet Take 1 tablet (50 mg total) by mouth every 6 (six) hours as needed. 07/26/15   Burgess Amor, PA-C   BP 132/80 mmHg  Pulse 78  Temp(Src) 98.9 F (37.2 C)  Resp 16  Ht  (1.803 m)  Wt 104.327 kg  BMI 32.09 kg/m2  SpO2 99%   Physical Exam  Constitutional: He is oriented to  person, place, and time. He appears well-developed and well-nourished. No distress.  HENT:  Head: Normocephalic and atraumatic.  Eyes: Conjunctivae and EOM are normal.  Neck: Neck supple. No tracheal deviation present.  Cardiovascular: Normal rate.   Pulmonary/Chest: Effort normal. No respiratory distress.  Musculoskeletal: Normal range of motion.  TTP over right distal 4th and 5th metacarpals with no appreciable edema dorsally, thenar eminence mildly edematous no bruising, distal sensation present but reduced in comparison to other fingers. No forearm pain.   Neurological: He is alert and oriented to person, place, and time.  Skin: Skin is warm and dry.  Cap refill less than 2 seconds.  Psychiatric: He has a normal mood and affect. His behavior is normal.  Nursing note and vitals reviewed.   ED Course  Procedures (including critical care time) DIAGNOSTIC STUDIES: Oxygen Saturation is 99% on RA, normal by my interpretation.    COORDINATION OF CARE: 4:02 PM-Discussed treatment plan with pt at bedside and pt agreed to plan.     Labs Review Labs Reviewed - No data to display  Imaging Review Dg Hand Complete Right  07/26/2015  CLINICAL DATA:  Crush injury to the right hand earlier today when the hand was squeezed between 2 weights while working out. Initial encounter. Pain localizing to the 5th metacarpal region. EXAM: RIGHT HAND - COMPLETE 3+ VIEW COMPARISON:  None. FINDINGS: Soft tissue swelling overlying the 5th metacarpal. No  evidence of acute fracture or dislocation. Joint spaces well preserved. Well-preserved bone mineral density. No intrinsic osseous abnormalities. IMPRESSION: No osseous abnormality. Electronically Signed   By: Hulan Saas M.D.   On: 07/26/2015 15:24   I have personally reviewed and evaluated these images as part of my medical decision-making.   EKG Interpretation None      MDM   Final diagnoses:  Hand crush injury, right, initial encounter     Jones dressing applied, prescribed ibuprofen, tramadol.  Ice, elevation.  Discussed signs of compartment syndrome, pt understands return for any worsened sx.  Referral also given to orthopedics for prn f/u if sx do not improve with todays tx.  I personally performed the services described in this documentation, which was scribed in my presence. The recorded information has been reviewed and is accurate.     Burgess Amor, PA-C 07/28/15 1610  Glynn Octave, MD 07/28/15 [phone]

## 2015-07-26 NOTE — ED Notes (Signed)
Right hand was caught between two weights. Occurred ~ 2 hours PTA.

## 2016-07-30 ENCOUNTER — Ambulatory Visit: Payer: Medicaid Other

## 2016-07-30 ENCOUNTER — Ambulatory Visit
Admission: EM | Admit: 2016-07-30 | Discharge: 2016-07-30 | Disposition: A | Discharge status: Home / Self Care | Payer: Medicaid Other | Attending: Family Medicine | Admitting: Family Medicine

## 2016-07-30 ENCOUNTER — Encounter: Payer: Self-pay | Admitting: Gynecology

## 2016-07-30 DIAGNOSIS — F419 Anxiety disorder, unspecified: Secondary | ICD-10-CM | POA: Insufficient documentation

## 2016-07-30 DIAGNOSIS — M7989 Other specified soft tissue disorders: Secondary | ICD-10-CM | POA: Diagnosis not present

## 2016-07-30 DIAGNOSIS — X58XXXA Exposure to other specified factors, initial encounter: Secondary | ICD-10-CM | POA: Insufficient documentation

## 2016-07-30 DIAGNOSIS — S6991XA Unspecified injury of right wrist, hand and finger(s), initial encounter: Secondary | ICD-10-CM | POA: Diagnosis not present

## 2016-07-30 DIAGNOSIS — Z79899 Other long term (current) drug therapy: Secondary | ICD-10-CM | POA: Insufficient documentation

## 2016-07-30 DIAGNOSIS — F1010 Alcohol abuse, uncomplicated: Secondary | ICD-10-CM | POA: Diagnosis not present

## 2016-07-30 DIAGNOSIS — M79641 Pain in right hand: Secondary | ICD-10-CM | POA: Diagnosis not present

## 2016-07-30 DIAGNOSIS — R002 Palpitations: Secondary | ICD-10-CM | POA: Diagnosis not present

## 2016-07-30 DIAGNOSIS — Z9889 Other specified postprocedural states: Secondary | ICD-10-CM | POA: Insufficient documentation

## 2016-07-30 DIAGNOSIS — F17210 Nicotine dependence, cigarettes, uncomplicated: Secondary | ICD-10-CM | POA: Diagnosis not present

## 2016-07-30 HISTORY — DX: Alcohol abuse, uncomplicated: F10.10

## 2016-07-30 IMAGING — CR DG HAND COMPLETE 3+V*R*
3 series · 3 of 3 positions shown · non-contrast
Comparison: Plain films right hand [DATE].

CLINICAL DATA: The patient struck the back of his right hand
against a hard object 2 weeks ago with onset of pain. Initial
encounter.

EXAM:
RIGHT HAND - COMPLETE 3+ VIEW

[hand ap]
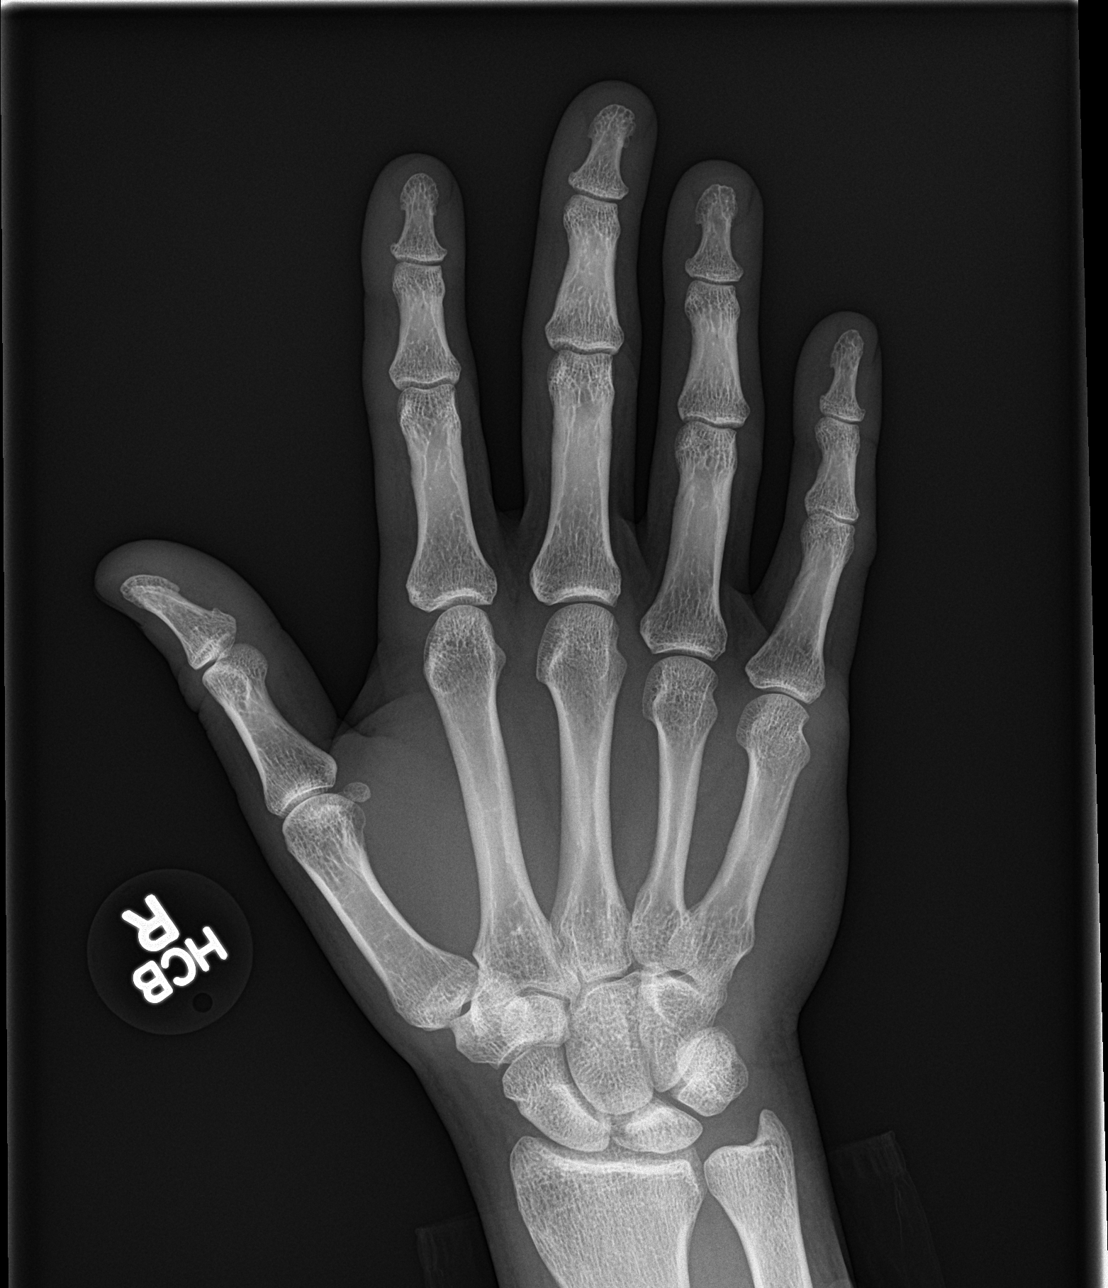

[hand obl]
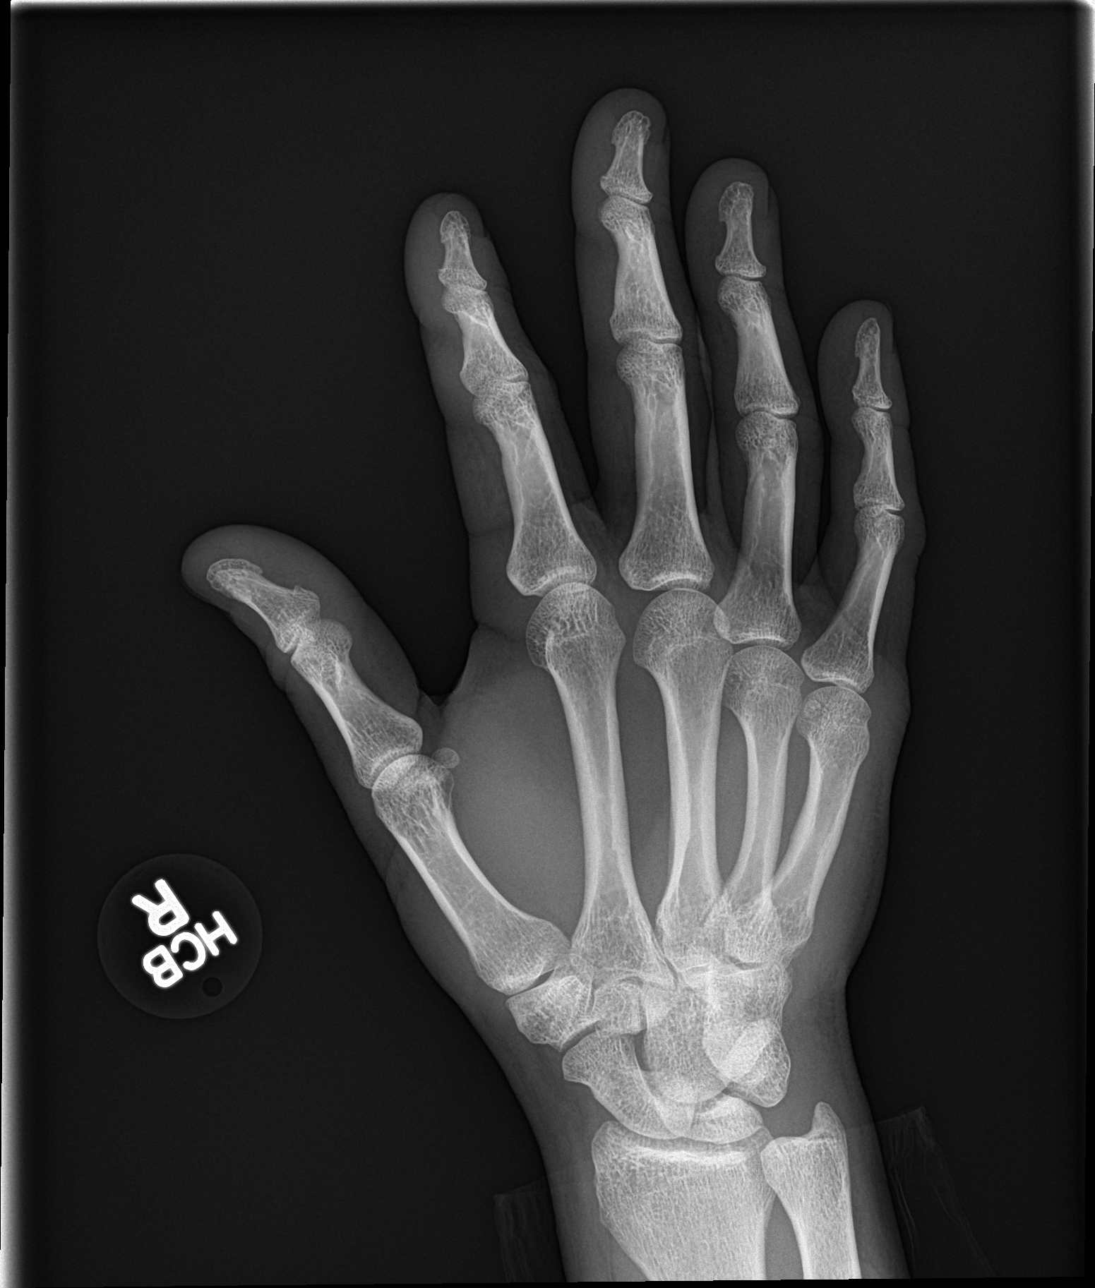

[hand lat]
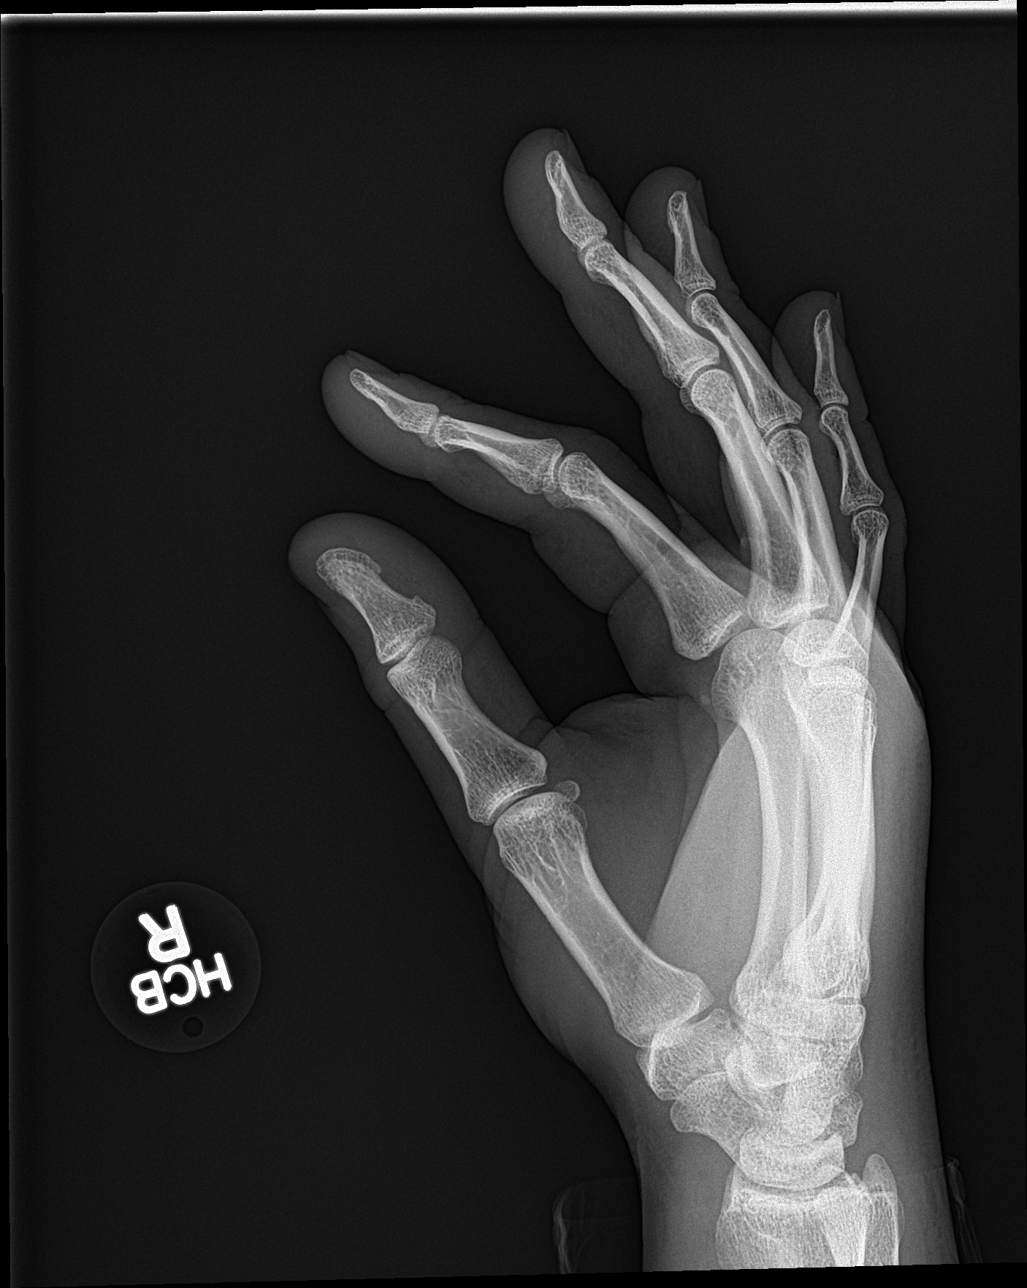

[3 of 3 positions shown; findings below may reference images not displayed]

FINDINGS: There is no evidence of fracture or dislocation. There is no
evidence of arthropathy or other focal bone abnormality. Soft
tissues are unremarkable.
IMPRESSION: Negative exam.

## 2016-07-30 NOTE — ED Provider Notes (Signed)
[facility]    CSN: [id number] Arrival date & time: 07/30/16  1324  History   Chief Complaint Chief Complaint  Patient presents with  . Hand Injury   HPI  36 year old male with alcohol abuse presents with complaints of alcohol withdrawal and a right hand injury.  Patient states that 1.5 days ago he developed pain and swelling of his right hand. 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 Hand is swollen and very tender.  No medications tried.  Additionally, Patient states that he's been trying to stop drinking and has been having anxiety, palpitations, and elevated blood pressure consistent with alcohol withdrawal. He states that he currently drinks 6-8, 40 ounce beers a day. He has tried to stop and has also tried to cut back significantly and has been unable to do so given his symptoms. He wants to discuss this today. He is requesting medication to help with this.  Past Medical History:  Diagnosis Date  . Alcohol abuse    alcohol withdrawal   Past Surgical History:  Procedure Laterality Date  . APPENDECTOMY      Home Medications    Prior to Admission medications   Medication Sig Start Date End Date Taking? Authorizing Provider  ibuprofen (ADVIL,MOTRIN) 600 MG tablet Take 1 tablet (600 mg total) by mouth every 6 (six) hours as needed. 07/26/15   Burgess AmorJulie Idol, PA-C  traMADol (ULTRAM) 50 MG tablet Take 1 tablet (50 mg total) by mouth every 6 (six) hours as needed. 07/26/15   Burgess AmorJulie Idol, PA-C   Family History History reviewed. No pertinent family history.  Social History Social History  Substance Use Topics  . Smoking status: Current Every Day Smoker    Packs/day: 2.00  . Smokeless tobacco: Never Used  . Alcohol use No   Allergies   Patient has no known allergies.  Review of Systems Review of Systems  Cardiovascular: Positive for palpitations.  Musculoskeletal:       Hand pain.  Psychiatric/Behavioral: The  patient is nervous/anxious.   All other systems reviewed and are negative.  Physical Exam Triage Vital Signs ED Triage Vitals  Enc Vitals Group     BP 07/30/16 1354 (!) 141/80     Pulse Rate 07/30/16 1354 76     Resp 07/30/16 1354 16     Temp 07/30/16 1354 98.1 F (36.7 C)     Temp Source 07/30/16 1354 Oral     SpO2 07/30/16 1354 100 %     Weight 07/30/16 1355 208 lb (94.3 kg)     Height 07/30/16 1355 5\' 11"  (1.803 m)     Head Circumference --      Peak Flow --      Pain Score 07/30/16 1358 7     Pain Loc --      Pain Edu? --      Excl. in GC? --    Updated Vital Signs BP (!) 141/80 (BP Location: Left Arm)   Pulse 76   Temp 98.1 F (36.7 C) (Oral)   Resp 16   Ht 5\' 11"  (1.803 m)   Wt 208 lb (94.3 kg)   SpO2 100%   BMI 29.01 kg/m   Physical Exam  Constitutional: He is oriented to person, place, and time. He appears well-developed. No distress.  HENT:  Head: Normocephalic and atraumatic.  Eyes: Conjunctivae are normal.  Neck: Normal range of motion.  Cardiovascular: Normal rate and regular  rhythm.   Pulmonary/Chest: Effort normal and breath sounds normal.  Abdominal: Soft. He exhibits no distension.  Musculoskeletal:  Right hand - dorsal swelling. Tenderness noted diffusely with palpation of the metacarpals, particularly the fourth and fifth.   Neurological: He is alert and oriented to person, place, and time.  Psychiatric:  Flat affect, depressed mood.  Vitals reviewed.  UC Treatments / Results  Labs (all labs ordered are listed, but only abnormal results are displayed) Labs Reviewed - No data to display  EKG  EKG Interpretation None       Radiology Dg Hand Complete Right  Result Date: 07/30/2016 CLINICAL DATA:  The patient struck the back of his right hand against a hard object 2 weeks ago with onset of pain. Initial encounter. EXAM: RIGHT HAND - COMPLETE 3+ VIEW COMPARISON:  Plain films right hand 11/09/2005. FINDINGS: There is no evidence of  fracture or dislocation. There is no evidence of arthropathy or other focal bone abnormality. Soft tissues are unremarkable. IMPRESSION: Negative exam. Electronically Signed   By: Drusilla Kanner M.D.   On: 07/30/2016 14:54    Procedures Procedures (including critical care time)  Medications Ordered in UC Medications - No data to display   Initial Impression / Assessment and Plan / UC Course  I have reviewed the triage vital signs and the nursing notes.  Pertinent labs & imaging results that were available during my care of the patient were reviewed by me and considered in my medical decision making (see chart for details).    36 year old male presents with a right hand injury. X-ray negative. Advised RICE and PRN ibuprofen. Additionally, patient wanted to quit alcohol. He's recently been having withdrawal symptoms when he cut back so goes without. Spoke with social work on call. She advised him to touch base with residential treatment services in [place]. Gave information. Advised to cut back on alcohol slowly and to abrupt discontinuation.  Final Clinical Impressions(s) / UC Diagnoses   Final diagnoses:  Injury of right hand, initial encounter  Alcohol abuse   New Prescriptions New Prescriptions   No medications on file      Tommie Sams, DO 07/30/16 1519

## 2016-07-30 NOTE — ED Triage Notes (Signed)
Patient present with swollen right hand. Per patient smash hand in wall.

## 2016-07-30 NOTE — Discharge Instructions (Signed)
No fracture. Rest, ice, elevation.  Ibuprofen as needed.  Call [facility] Monday - Address: [address], [place], [place] [address]  Phone: [phone]  Take care  Dr. Adriana Simasook

## 2016-07-31 ENCOUNTER — Encounter: Payer: Self-pay | Admitting: Emergency Medicine

## 2016-07-31 ENCOUNTER — Emergency Department
Admission: EM | Admit: 2016-07-31 | Discharge: 2016-07-31 | Disposition: A | Discharge status: Home / Self Care | Payer: Medicaid Other | Attending: Emergency Medicine | Admitting: Emergency Medicine

## 2016-07-31 DIAGNOSIS — Z5321 Procedure and treatment not carried out due to patient leaving prior to being seen by health care provider: Secondary | ICD-10-CM | POA: Diagnosis not present

## 2016-07-31 DIAGNOSIS — F17210 Nicotine dependence, cigarettes, uncomplicated: Secondary | ICD-10-CM | POA: Insufficient documentation

## 2016-07-31 DIAGNOSIS — M7989 Other specified soft tissue disorders: Secondary | ICD-10-CM | POA: Diagnosis present

## 2016-07-31 DIAGNOSIS — M79643 Pain in unspecified hand: Secondary | ICD-10-CM

## 2016-07-31 HISTORY — DX: Other psychoactive substance abuse, uncomplicated: F19.10

## 2016-07-31 LAB — CBC WITH DIFFERENTIAL/PLATELET
Basophils Absolute: 0.1 K/uL (ref 0–0.1)
Basophils Relative: 1 %
Eosinophils Absolute: 0.2 K/uL (ref 0–0.7)
Eosinophils Relative: 3 %
HCT: 40.8 % (ref 40.0–52.0)
Hemoglobin: 14.1 g/dL (ref 13.0–18.0)
Lymphocytes Relative: 38 %
Lymphs Abs: 2.7 K/uL (ref 1.0–3.6)
MCH: 30.6 pg (ref 26.0–34.0)
MCHC: 34.6 g/dL (ref 32.0–36.0)
MCV: 88.5 fL (ref 80.0–100.0)
Monocytes Absolute: 0.9 K/uL (ref 0.2–1.0)
Monocytes Relative: 12 %
Neutro Abs: 3.4 K/uL (ref 1.4–6.5)
Neutrophils Relative %: 46 %
Platelets: 281 K/uL (ref 150–440)
RBC: 4.61 MIL/uL (ref 4.40–5.90)
RDW: 13.5 % (ref 11.5–14.5)
WBC: 7.3 K/uL (ref 3.8–10.6)

## 2016-07-31 LAB — BASIC METABOLIC PANEL
Anion gap: 11 (ref 5–15)
BUN: 13 mg/dL (ref 6–20)
CALCIUM: 9 mg/dL (ref 8.9–10.3)
CO2: 23 mmol/L (ref 22–32)
CREATININE: 1.03 mg/dL (ref 0.61–1.24)
Chloride: 104 mmol/L (ref 101–111)
GFR calc Af Amer: >60 mL/min (ref >60)
GFR calc non Af Amer: >60 mL/min (ref >60)
GLUCOSE: 105 mg/dL — AB (ref 65–99)
Potassium: 3.4 mmol/L — ABNORMAL LOW (ref 3.5–5.1)
Sodium: 138 mmol/L (ref 135–145)

## 2016-07-31 NOTE — ED Provider Notes (Signed)
The patient left the emergency department prior to evaluation.   Adam MenghiniAnne-Caroline Leenah Seidner, MD 07/31/16 [phone]

## 2016-07-31 NOTE — ED Triage Notes (Signed)
C/O right hand pain and swelling x 3 days.  Patient states he uses IV drugs and was seen yesterday at [facility] for same.  Had Xrays done.

## 2016-08-16 ENCOUNTER — Emergency Department
Admission: EM | Admit: 2016-08-16 | Discharge: 2016-08-16 | Disposition: A | Discharge status: Home / Self Care | Payer: Medicaid Other | Attending: Emergency Medicine | Admitting: Emergency Medicine

## 2016-08-16 ENCOUNTER — Emergency Department: Payer: Medicaid Other

## 2016-08-16 ENCOUNTER — Encounter: Payer: Self-pay | Admitting: Medical Oncology

## 2016-08-16 DIAGNOSIS — F17210 Nicotine dependence, cigarettes, uncomplicated: Secondary | ICD-10-CM | POA: Insufficient documentation

## 2016-08-16 DIAGNOSIS — Z791 Long term (current) use of non-steroidal anti-inflammatories (NSAID): Secondary | ICD-10-CM | POA: Diagnosis not present

## 2016-08-16 DIAGNOSIS — Y939 Activity, unspecified: Secondary | ICD-10-CM | POA: Diagnosis not present

## 2016-08-16 DIAGNOSIS — Y929 Unspecified place or not applicable: Secondary | ICD-10-CM | POA: Insufficient documentation

## 2016-08-16 DIAGNOSIS — S41112A Laceration without foreign body of left upper arm, initial encounter: Secondary | ICD-10-CM | POA: Insufficient documentation

## 2016-08-16 DIAGNOSIS — S4992XA Unspecified injury of left shoulder and upper arm, initial encounter: Secondary | ICD-10-CM | POA: Diagnosis present

## 2016-08-16 DIAGNOSIS — W010XXA Fall on same level from slipping, tripping and stumbling without subsequent striking against object, initial encounter: Secondary | ICD-10-CM | POA: Diagnosis not present

## 2016-08-16 DIAGNOSIS — Y999 Unspecified external cause status: Secondary | ICD-10-CM | POA: Diagnosis not present

## 2016-08-16 IMAGING — CR DG HUMERUS 2V *L*
2 series · 2 of 2 positions shown · non-contrast
Comparison: None.

CLINICAL DATA: 35-year-old male with fall and left arm pain.

EXAM:
LEFT HUMERUS - 2+ VIEW

[humerus ap]
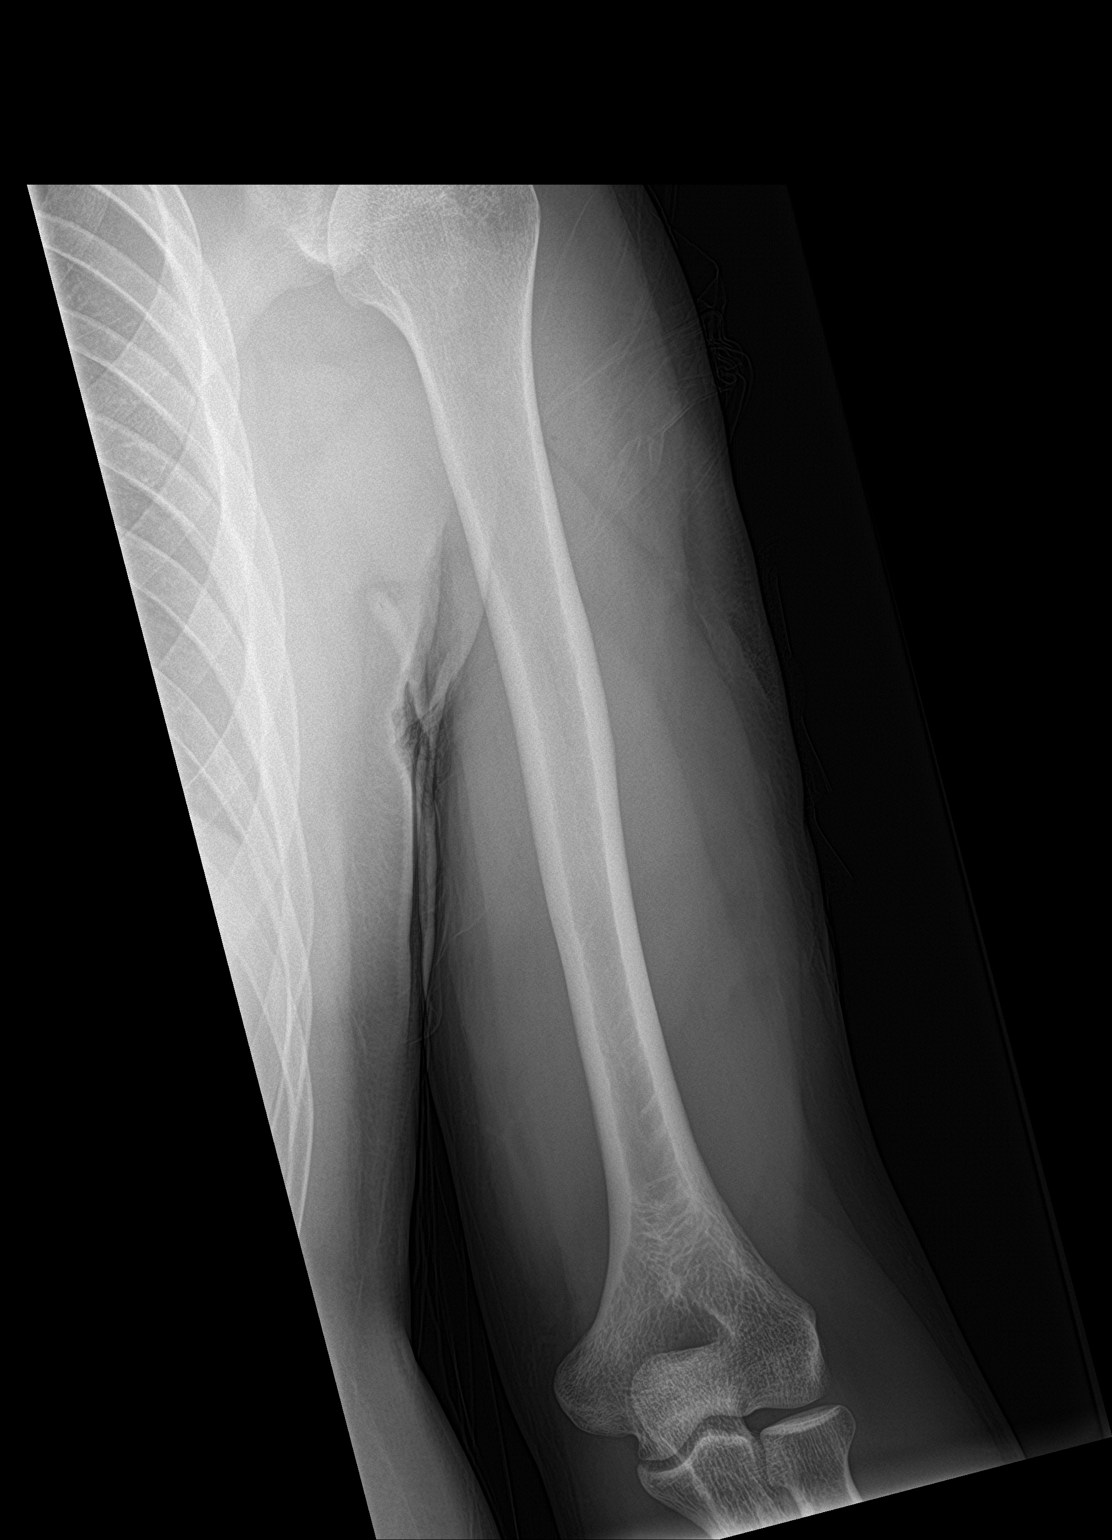

[humerus lat]
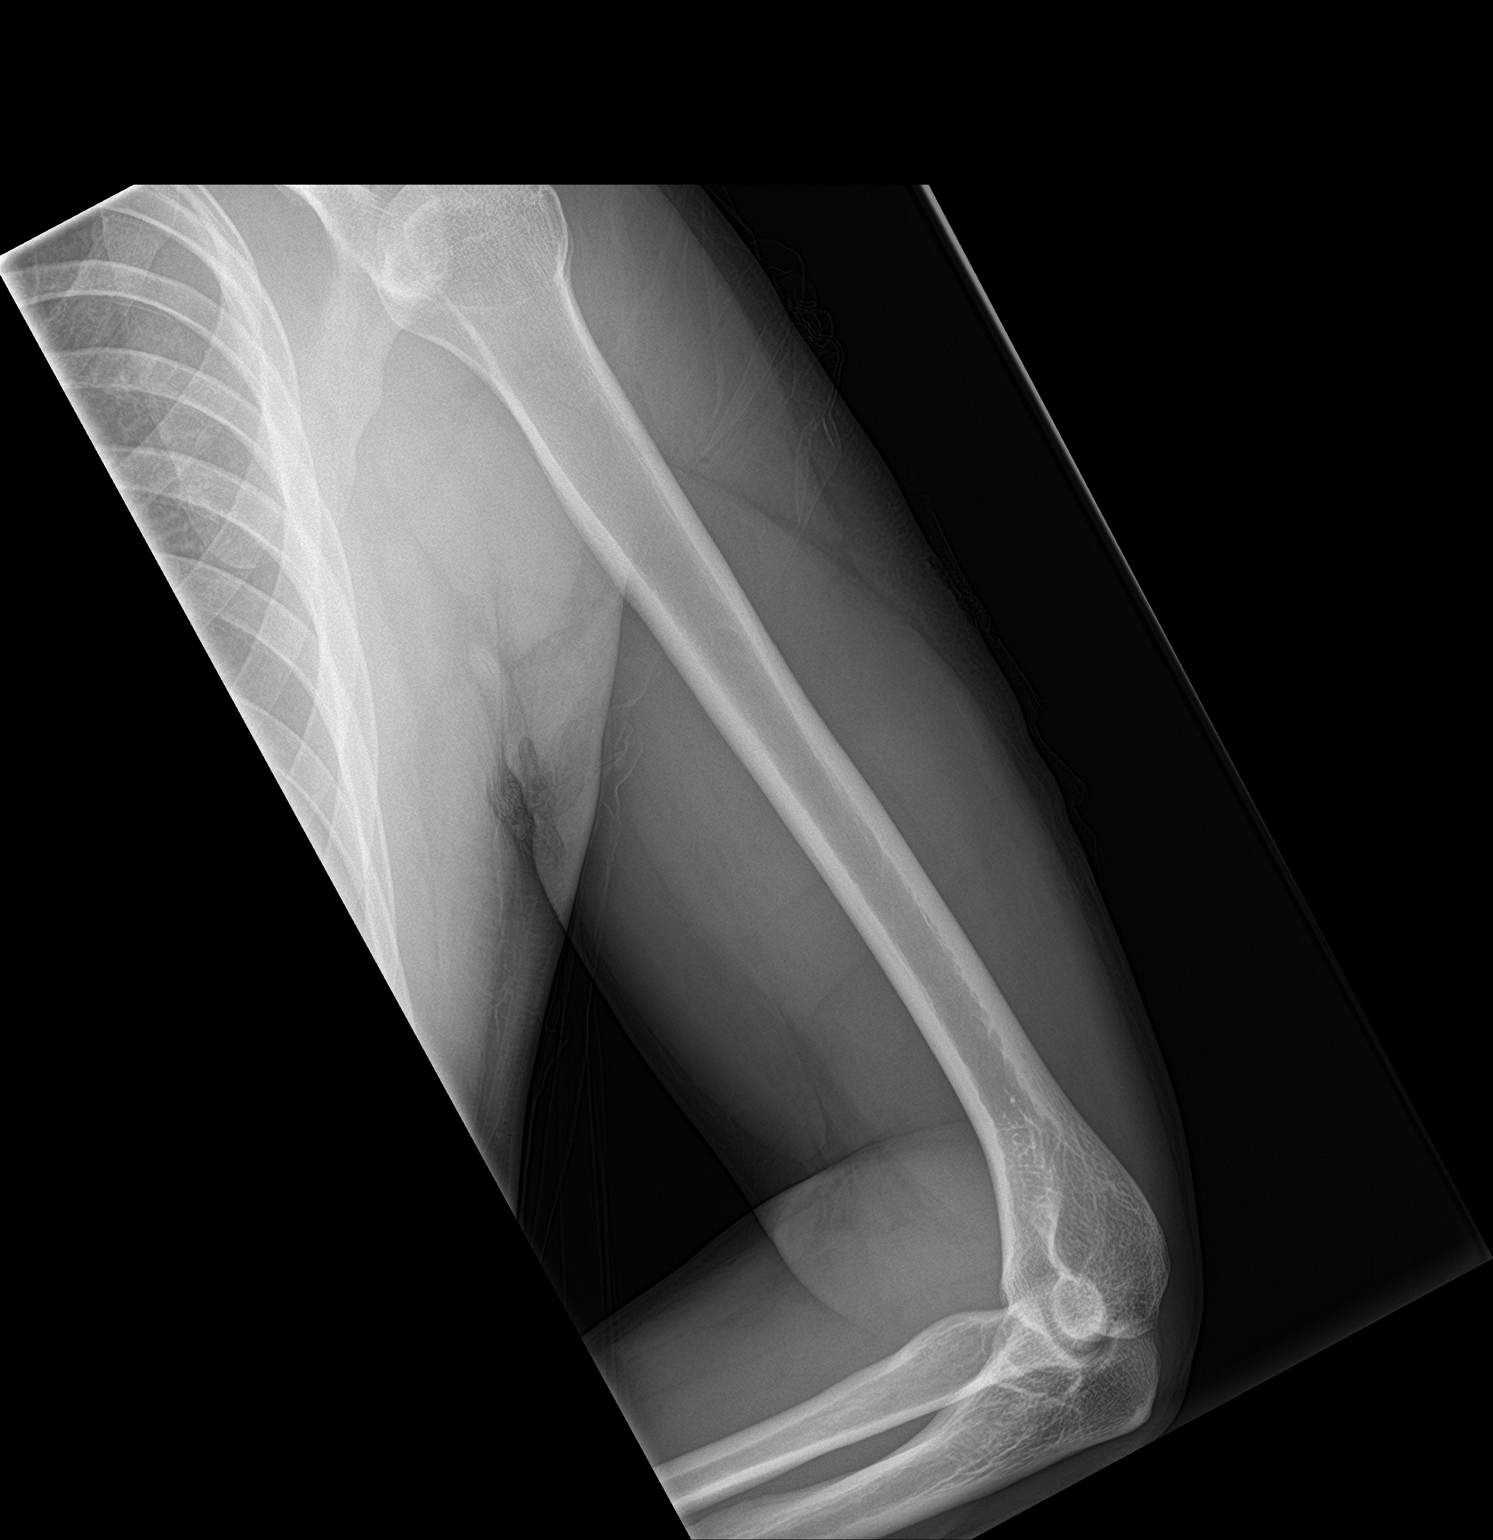

[2 of 2 positions shown; findings below may reference images not displayed]

FINDINGS: There is no acute fracture or dislocation. The bones are well
mineralized. No arthritic changes. A small amount of joint effusion
may be present in the left glenohumeral joint. The soft tissues
appear unremarkable.
IMPRESSION: No acute fracture or dislocation.

## 2016-08-16 MED ORDER — LIDOCAINE HCL (PF) 1 % IJ SOLN
5.0000 mL | Freq: Once | INTRAMUSCULAR | Status: DC
Start: 2016-08-16 — End: 2016-08-16
  Filled 2016-08-16: qty 5

## 2016-08-16 MED ORDER — CEPHALEXIN 500 MG PO CAPS: 500.0000 mg | ORAL_CAPSULE | Freq: Four times a day (QID) | ORAL | 0 refills | Status: AC

## 2016-08-16 NOTE — Discharge Instructions (Signed)
Please take medications as prescribed. Keep area clean and dry.

## 2016-08-16 NOTE — ED Triage Notes (Addendum)
Pt here via ems with reports that he was walking a trail around 1700 when he tripped and fell, has lac to left upper arm, bleeding controlled. Last tetanus around 2 years ago.

## 2016-08-16 NOTE — ED Provider Notes (Signed)
[facility]EMERGENCY DEPARTMENT Provider Note   CSN: [id number] Arrival date & time: 08/16/16  1753     History   Chief Complaint Chief Complaint  Patient presents with  . Laceration    HPI Adam Donovan is a 36 y.o. male presents emergency department for evaluation of laceration to the left arm. Patient states from 5 PM he tripped and fell landing on his left arm. He suffered a laceration. His tetanus is up-to-date. Denies any numbness or tingling. No other injury to his body.  HPI  Past Medical History:  Diagnosis Date  . Alcohol abuse    alcohol withdrawal  . IV drug abuse     There are no active problems to display for this patient.   Past Surgical History:  Procedure Laterality Date  . APPENDECTOMY         Home Medications    Prior to Admission medications   Medication Sig Start Date End Date Taking? Authorizing Provider  cephALEXin (KEFLEX) 500 MG capsule Take 1 capsule (500 mg total) by mouth 4 (four) times daily. 08/16/16 08/26/16  Evon Slackhomas C Rowe Warman, PA-C  ibuprofen (ADVIL,MOTRIN) 600 MG tablet Take 1 tablet (600 mg total) by mouth every 6 (six) hours as needed. 07/26/15   Burgess AmorJulie Idol, PA-C  traMADol (ULTRAM) 50 MG tablet Take 1 tablet (50 mg total) by mouth every 6 (six) hours as needed. 07/26/15   Burgess AmorJulie Idol, PA-C    Family History No family history on file.  Social History Social History  Substance Use Topics  . Smoking status: Current Every Day Smoker    Packs/day: 2.00    Types: Cigarettes  . Smokeless tobacco: Never Used  . Alcohol use Yes     Comment: daily 6 - 40 oz beers     Allergies   Patient has no known allergies.   Review of Systems Review of Systems  Constitutional: Negative for chills and fever.  HENT: Negative for ear pain and sore throat.   Eyes: Negative for pain and visual disturbance.  Respiratory: Negative for cough and shortness of breath.   Cardiovascular: Negative for chest pain and palpitations.  Gastrointestinal:  Negative for abdominal pain and vomiting.  Genitourinary: Negative for dysuria and hematuria.  Musculoskeletal: Negative for arthralgias and back pain.  Skin: Positive for wound. Negative for color change and rash.  Neurological: Negative for seizures and syncope.  All other systems reviewed and are negative.    Physical Exam Updated Vital Signs BP (!) 141/72 (BP Location: Left Arm)   Pulse 96   Temp 98.7 F (37.1 C) (Oral)   Resp 18   Ht 5\' 11"  (1.803 m)   Wt 81.6 kg   SpO2 97%   BMI 25.10 kg/m   Physical Exam  Constitutional: He appears well-developed and well-nourished.  HENT:  Head: Normocephalic and atraumatic.  Eyes: Conjunctivae are normal.  Neck: Neck supple.  Cardiovascular: Normal rate and regular rhythm.   No murmur heard. Pulmonary/Chest: Effort normal. No respiratory distress. He has no wheezes.  Abdominal: Soft. There is no tenderness.  Musculoskeletal: He exhibits no edema.  Neurological: He is alert.  Skin: Skin is warm and dry.  5 cm laceration, linear to the left arm, triceps region mid humerus. No foreign body. One to 1-2cm gaping.  Psychiatric: He has a normal mood and affect.  Nursing note and vitals reviewed.    ED Treatments / Results  Labs (all labs ordered are listed, but only abnormal results are displayed) Labs Reviewed - No  data to display  EKG  EKG Interpretation None       Radiology Dg Humerus Left  Result Date: 08/16/2016 CLINICAL DATA:  36 year old male with fall and left arm pain. EXAM: LEFT HUMERUS - 2+ VIEW COMPARISON:  None. FINDINGS: There is no acute fracture or dislocation. The bones are well mineralized. No arthritic changes. A small amount of joint effusion may be present in the left glenohumeral joint. The soft tissues appear unremarkable. IMPRESSION: No acute fracture or dislocation. Electronically Signed   By: Elgie Collard M.D.   On: 08/16/2016 18:24    Procedures Procedures (including critical care  time) LACERATION REPAIR Performed by: Patience Musca Authorized by: Patience Musca Consent: Verbal consent obtained. Risks and benefits: risks, benefits and alternatives were discussed Consent given by: patient Patient identity confirmed: provided demographic data Prepped and Draped in normal sterile fashion Wound explored  Laceration Location: Left arm  Laceration Length: 5 cm  No Foreign Bodies seen or palpated  Anesthesia: local infiltration  Local anesthetic: lidocaine 1 % without epinephrine  Anesthetic total: 3 ml  Irrigation method: syringe Amount of cleaning: standard  Skin closure: 2 deep horizontal mattress sutures Vicryl , 7 simple interrupted nylon   Number of sutures: 9   Patient tolerance: Patient tolerated the procedure well with no immediate complications.   Medications Ordered in ED Medications  lidocaine (PF) (XYLOCAINE) 1 % injection 5 mL (not administered)     Initial Impression / Assessment and Plan / ED Course  I have reviewed the triage vital signs and the nursing notes.  Pertinent labs & imaging results that were available during my care of the patient were reviewed by me and considered in my medical decision making (see chart for details).     36 year old male with lacerations left arm. Follow-up in 8-10 days for suture removal. Educated on signs and symptoms return to the ED for.  Final Clinical Impressions(s) / ED Diagnoses   Final diagnoses:  Arm laceration, left, initial encounter    New Prescriptions New Prescriptions   CEPHALEXIN (KEFLEX) 500 MG CAPSULE    Take 1 capsule (500 mg total) by mouth 4 (four) times daily.     Evon Slack, PA-C 08/16/16 1936    Nita Sickle, MD 08/17/16 [phone]

## 2017-04-13 ENCOUNTER — Emergency Department: Payer: Medicaid Other

## 2017-04-13 ENCOUNTER — Emergency Department
Admission: EM | Admit: 2017-04-13 | Discharge: 2017-04-13 | Disposition: A | Discharge status: Home / Self Care | Payer: Medicaid Other | Attending: Emergency Medicine | Admitting: Emergency Medicine

## 2017-04-13 ENCOUNTER — Encounter: Payer: Self-pay | Admitting: Emergency Medicine

## 2017-04-13 ENCOUNTER — Other Ambulatory Visit: Payer: Self-pay

## 2017-04-13 DIAGNOSIS — Y998 Other external cause status: Secondary | ICD-10-CM | POA: Diagnosis not present

## 2017-04-13 DIAGNOSIS — F1910 Other psychoactive substance abuse, uncomplicated: Secondary | ICD-10-CM | POA: Insufficient documentation

## 2017-04-13 DIAGNOSIS — S0990XA Unspecified injury of head, initial encounter: Secondary | ICD-10-CM | POA: Diagnosis not present

## 2017-04-13 DIAGNOSIS — F17210 Nicotine dependence, cigarettes, uncomplicated: Secondary | ICD-10-CM | POA: Insufficient documentation

## 2017-04-13 DIAGNOSIS — Y92828 Other wilderness area as the place of occurrence of the external cause: Secondary | ICD-10-CM | POA: Insufficient documentation

## 2017-04-13 DIAGNOSIS — S301XXA Contusion of abdominal wall, initial encounter: Secondary | ICD-10-CM | POA: Diagnosis not present

## 2017-04-13 DIAGNOSIS — F1010 Alcohol abuse, uncomplicated: Secondary | ICD-10-CM | POA: Diagnosis not present

## 2017-04-13 DIAGNOSIS — S0181XA Laceration without foreign body of other part of head, initial encounter: Secondary | ICD-10-CM | POA: Insufficient documentation

## 2017-04-13 DIAGNOSIS — S20211A Contusion of right front wall of thorax, initial encounter: Secondary | ICD-10-CM | POA: Insufficient documentation

## 2017-04-13 DIAGNOSIS — Y9389 Activity, other specified: Secondary | ICD-10-CM | POA: Insufficient documentation

## 2017-04-13 LAB — BASIC METABOLIC PANEL
Anion gap: 10 (ref 5–15)
BUN: 12 mg/dL (ref 6–20)
CALCIUM: 9 mg/dL (ref 8.9–10.3)
CHLORIDE: 104 mmol/L (ref 101–111)
CO2: 24 mmol/L (ref 22–32)
Creatinine, Ser: 1.07 mg/dL (ref 0.61–1.24)
Glucose, Bld: 111 mg/dL — ABNORMAL HIGH (ref 65–99)
Potassium: 3.3 mmol/L — ABNORMAL LOW (ref 3.5–5.1)
SODIUM: 138 mmol/L (ref 135–145)

## 2017-04-13 LAB — CBC WITH DIFFERENTIAL/PLATELET
BASOS PCT: 1 %
Basophils Absolute: 0.1 10*3/uL (ref 0–0.1)
EOS ABS: 0.1 10*3/uL (ref 0–0.7)
EOS PCT: 1 %
HCT: 40.8 % (ref 40.0–52.0)
HEMOGLOBIN: 13.9 g/dL (ref 13.0–18.0)
LYMPHS ABS: 2.1 10*3/uL (ref 1.0–3.6)
Lymphocytes Relative: 28 %
MCH: 30.3 pg (ref 26.0–34.0)
MCHC: 34.1 g/dL (ref 32.0–36.0)
MCV: 88.7 fL (ref 80.0–100.0)
MONOS PCT: 10 %
Monocytes Absolute: 0.7 10*3/uL (ref 0.2–1.0)
NEUTROS PCT: 60 %
Neutro Abs: 4.6 10*3/uL (ref 1.4–6.5)
PLATELETS: 309 10*3/uL (ref 150–440)
RBC: 4.6 MIL/uL (ref 4.40–5.90)
RDW: 13.9 % (ref 11.5–14.5)
WBC: 7.6 10*3/uL (ref 3.8–10.6)
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
## 2017-04-13 MED ORDER — LORAZEPAM 2 MG/ML IJ SOLN
1.0000 mg | Freq: Once | INTRAMUSCULAR | Status: AC
  Administered 2017-04-13: 1 mg via INTRAVENOUS
  Filled 2017-04-13: qty 1

## 2017-04-13 MED ORDER — SODIUM CHLORIDE 0.9 % IV BOLUS (SEPSIS)
1000.0000 mL | Freq: Once | INTRAVENOUS | Status: AC
  Administered 2017-04-13: 1000 mL via INTRAVENOUS

## 2017-04-13 MED ORDER — LIDOCAINE-EPINEPHRINE 2 %-1:100000 IJ SOLN
20.0000 mL | Freq: Once | INTRAMUSCULAR | Status: DC
  Filled 2017-04-13: qty 20

## 2017-04-13 MED ORDER — IOPAMIDOL (ISOVUE-300) INJECTION 61%
100.0000 mL | Freq: Once | INTRAVENOUS | Status: AC | PRN
  Administered 2017-04-13: 100 mL via INTRAVENOUS

## 2017-04-13 MED ORDER — KETOROLAC TROMETHAMINE 30 MG/ML IJ SOLN
30.0000 mg | Freq: Once | INTRAMUSCULAR | Status: AC
  Administered 2017-04-13: 30 mg via INTRAVENOUS
  Filled 2017-04-13: qty 1

## 2017-04-13 NOTE — ED Notes (Signed)
Pt took c-collar off

## 2017-04-13 NOTE — ED Notes (Signed)
Called without answer for room 

## 2017-04-13 NOTE — ED Notes (Signed)
Patient transported to CT 

## 2017-04-13 NOTE — ED Notes (Signed)
This RN tried to stick pt for IV, with no success. Dorinda Hillonald, RN trying at this time
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
## 2017-04-13 NOTE — ED Notes (Signed)
Discussed with dr paduchowski 
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
## 2017-04-13 NOTE — ED Notes (Signed)
Pt refused discharge vitals and signature.  
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
## 2017-05-30 ENCOUNTER — Encounter (HOSPITAL_COMMUNITY): Payer: Self-pay

## 2017-05-30 DIAGNOSIS — Z5321 Procedure and treatment not carried out due to patient leaving prior to being seen by health care provider: Secondary | ICD-10-CM | POA: Diagnosis not present

## 2017-05-30 DIAGNOSIS — R21 Rash and other nonspecific skin eruption: Secondary | ICD-10-CM | POA: Diagnosis present

## 2017-05-30 NOTE — ED Triage Notes (Signed)
Pt complains of an unknown rash all over his body some are deep from him picking them, she says he's not seen anything move but feels like it is

## 2017-05-30 NOTE — ED Triage Notes (Signed)
Pt called from triage with no answer 

## 2017-05-31 ENCOUNTER — Emergency Department (HOSPITAL_COMMUNITY)
Admission: EM | Admit: 2017-05-31 | Discharge: 2017-05-31 | Discharge status: Against Medical Advice | Payer: Medicaid Other | Attending: Emergency Medicine | Admitting: Emergency Medicine

## 2017-05-31 NOTE — ED Notes (Signed)
Pt no longer in the lobby 

## 2017-05-31 NOTE — ED Triage Notes (Signed)
Pt called for triage with no answer 

## 2017-06-04 ENCOUNTER — Other Ambulatory Visit: Payer: Self-pay

## 2017-06-04 ENCOUNTER — Encounter (HOSPITAL_COMMUNITY): Payer: Self-pay

## 2017-06-04 ENCOUNTER — Emergency Department (HOSPITAL_COMMUNITY)
Admission: EM | Admit: 2017-06-04 | Discharge: 2017-06-04 | Disposition: A | Discharge status: Home / Self Care | Payer: Medicaid Other | Attending: Emergency Medicine | Admitting: Emergency Medicine

## 2017-06-04 DIAGNOSIS — Z5321 Procedure and treatment not carried out due to patient leaving prior to being seen by health care provider: Secondary | ICD-10-CM | POA: Insufficient documentation

## 2017-06-04 DIAGNOSIS — L089 Local infection of the skin and subcutaneous tissue, unspecified: Secondary | ICD-10-CM | POA: Diagnosis present

## 2017-06-04 NOTE — ED Notes (Signed)
Called for room placement, no response.

## 2017-06-04 NOTE — ED Notes (Signed)
Per other staff, patient left about 30 mins ago. This RN never saw the patient. They left prior to my care. PA is aware.

## 2017-06-04 NOTE — ED Triage Notes (Signed)
Patient has open sores on face, shoulders, buttocks, some are scabbed over x 2 weeks. [place]

## 2017-06-04 NOTE — ED Provider Notes (Cosign Needed)
Went to room #8 to evaluate the patient and he had left without being seen.  BP 122/78 (BP Location: Right Arm)   Pulse 84   Temp 97.8 F (36.6 C) (Oral)   Resp 16   Ht 5\' 11"  (1.803 m)   Wt 90.7 kg (200 lb)   SpO2 98%   BMI 27.89 kg/m     Kerrie Buffaloeese, Evrett Hakim Mel[place]M, [place] 06/04/17 1934

## 2017-06-14 ENCOUNTER — Other Ambulatory Visit: Payer: Self-pay

## 2017-06-14 ENCOUNTER — Emergency Department (HOSPITAL_COMMUNITY)
Admission: EM | Admit: 2017-06-14 | Discharge: 2017-06-15 | Disposition: A | Discharge status: Home / Self Care | Payer: Medicaid Other | Attending: Emergency Medicine | Admitting: Emergency Medicine

## 2017-06-14 ENCOUNTER — Encounter (HOSPITAL_COMMUNITY): Payer: Self-pay | Admitting: Emergency Medicine

## 2017-06-14 ENCOUNTER — Emergency Department (HOSPITAL_COMMUNITY): Payer: Medicaid Other

## 2017-06-14 DIAGNOSIS — Y92142 Bathroom in prison as the place of occurrence of the external cause: Secondary | ICD-10-CM | POA: Diagnosis not present

## 2017-06-14 DIAGNOSIS — F17210 Nicotine dependence, cigarettes, uncomplicated: Secondary | ICD-10-CM | POA: Insufficient documentation

## 2017-06-14 DIAGNOSIS — F1193 Opioid use, unspecified with withdrawal: Secondary | ICD-10-CM

## 2017-06-14 DIAGNOSIS — Y939 Activity, unspecified: Secondary | ICD-10-CM | POA: Insufficient documentation

## 2017-06-14 DIAGNOSIS — W0110XA Fall on same level from slipping, tripping and stumbling with subsequent striking against unspecified object, initial encounter: Secondary | ICD-10-CM | POA: Insufficient documentation

## 2017-06-14 DIAGNOSIS — Y999 Unspecified external cause status: Secondary | ICD-10-CM | POA: Insufficient documentation

## 2017-06-14 DIAGNOSIS — R55 Syncope and collapse: Secondary | ICD-10-CM

## 2017-06-14 DIAGNOSIS — F1123 Opioid dependence with withdrawal: Secondary | ICD-10-CM | POA: Diagnosis not present

## 2017-06-14 DIAGNOSIS — L989 Disorder of the skin and subcutaneous tissue, unspecified: Secondary | ICD-10-CM | POA: Insufficient documentation

## 2017-06-14 IMAGING — CT CT T SPINE W/O CM
3 of 4 series · 8 of 33 positions shown, 10 images · non-contrast
Comparison: None.

CLINICAL DATA: Syncopal episode with fall in shower.  Back pain.

EXAM:
CT THORACIC SPINE WITHOUT CONTRAST
TECHNIQUE: Multidetector CT images of the thoracic were obtained using the
standard protocol without intravenous contrast.

[Series 4: t-spine 2.0 st · axial · 0.30mm/px · z∈[-433,-271]mm · 2 of 177 slices shown, 3 images]
[im 55/177  soft-tissue]
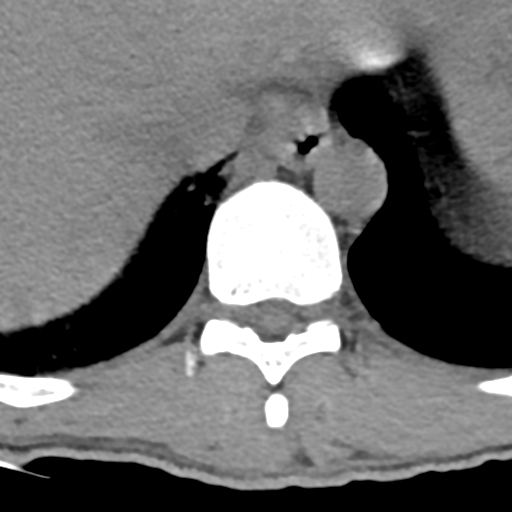
[im 55/177  bone]
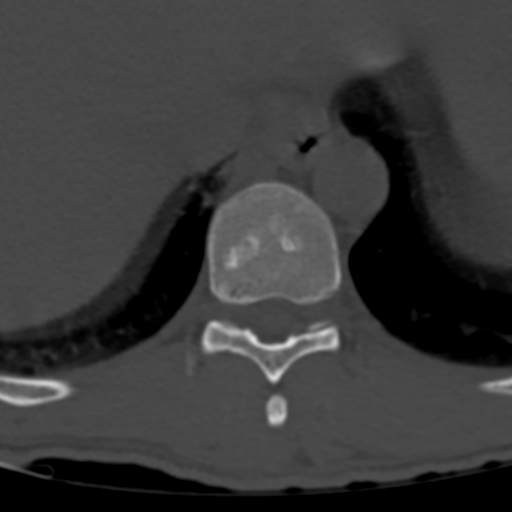
[im 136/177  bone]
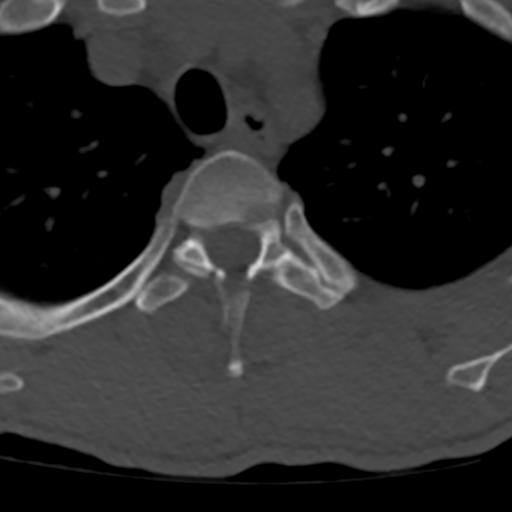

[Series 7: coronal bone · coronal · 0.23mm/px · 1 of 66 slices shown]
[im 33/66  bone]
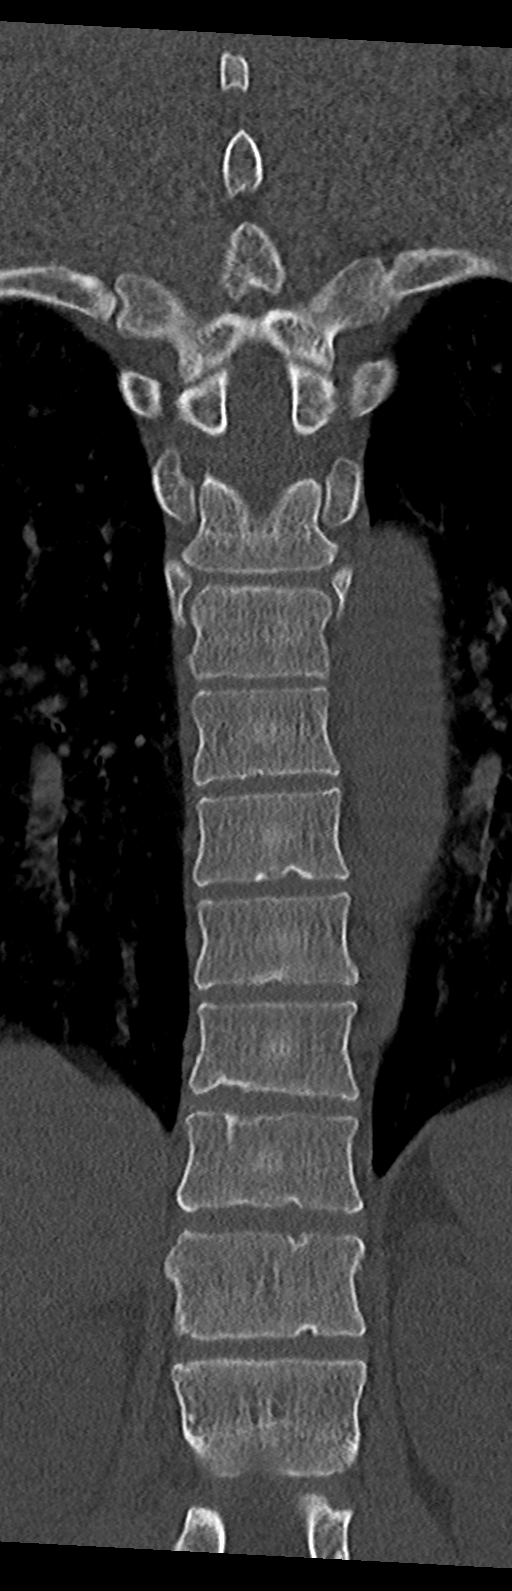

[Series 10: sagittal st · sagittal · 0.29mm/px · 5 of 61 slices shown, 6 images]
[im 21/61  bone]
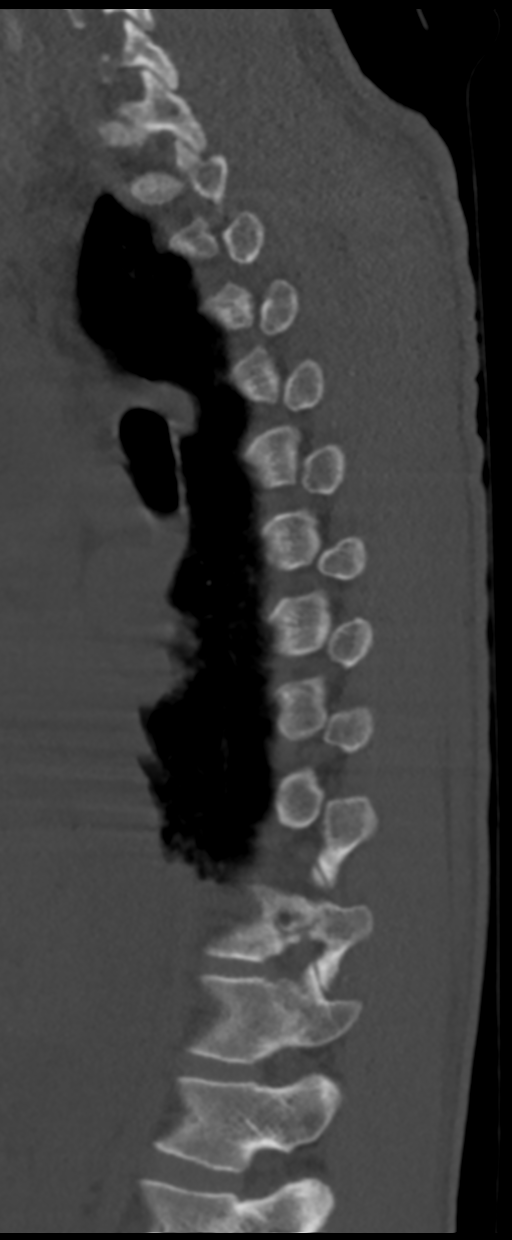
[im 26/61  bone]
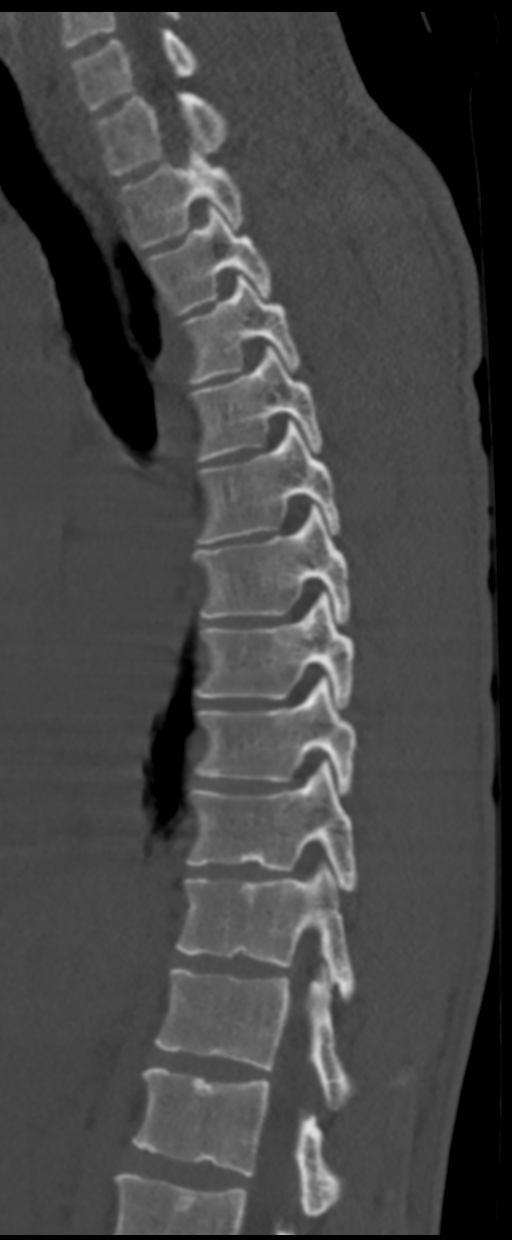
[im 31/61  soft-tissue]
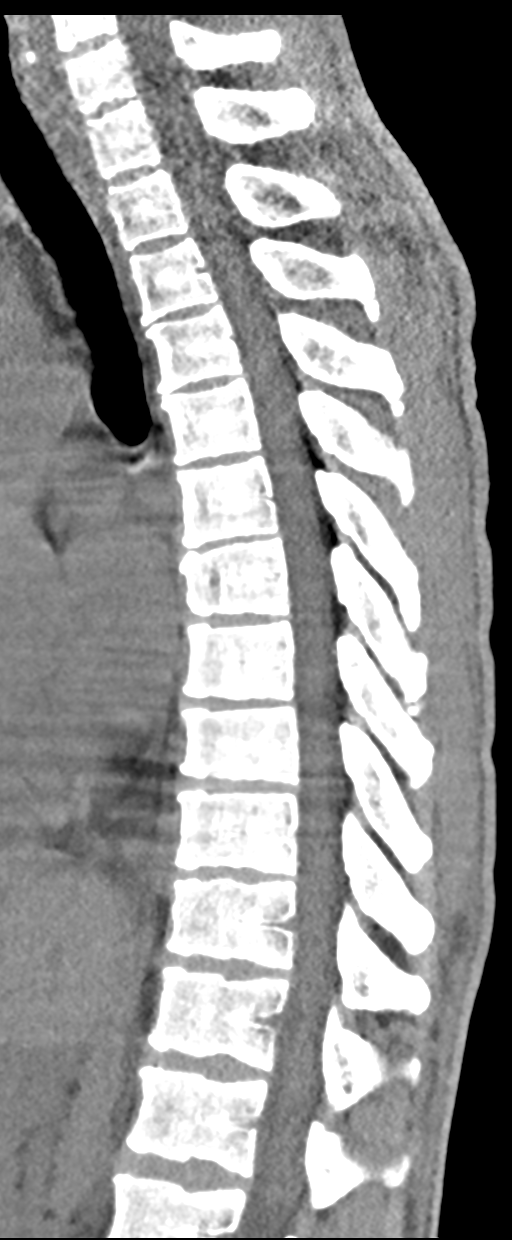
[im 31/61  bone]
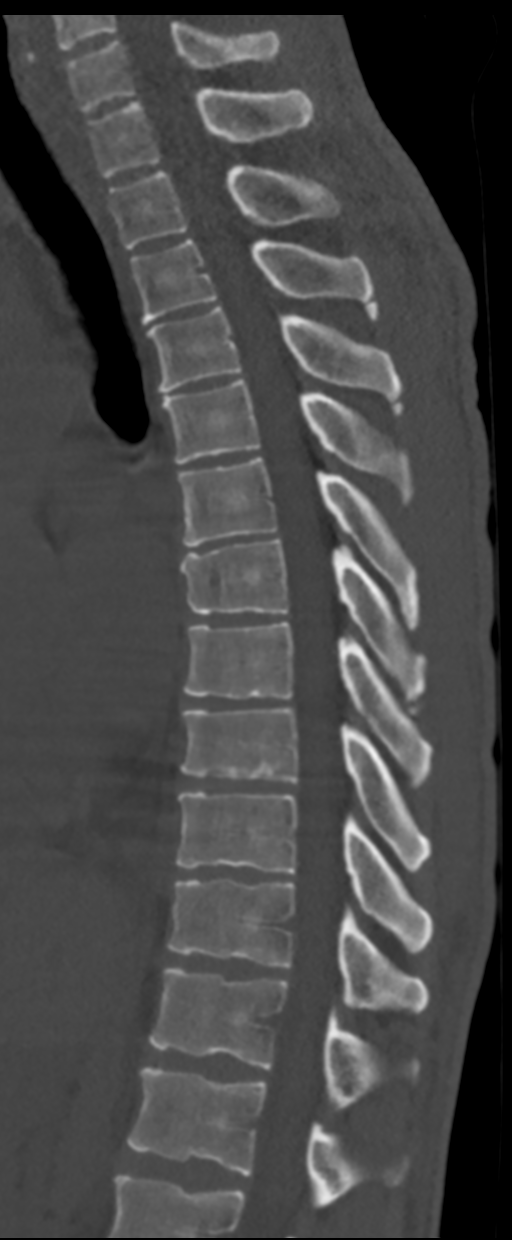
[im 36/61  bone]
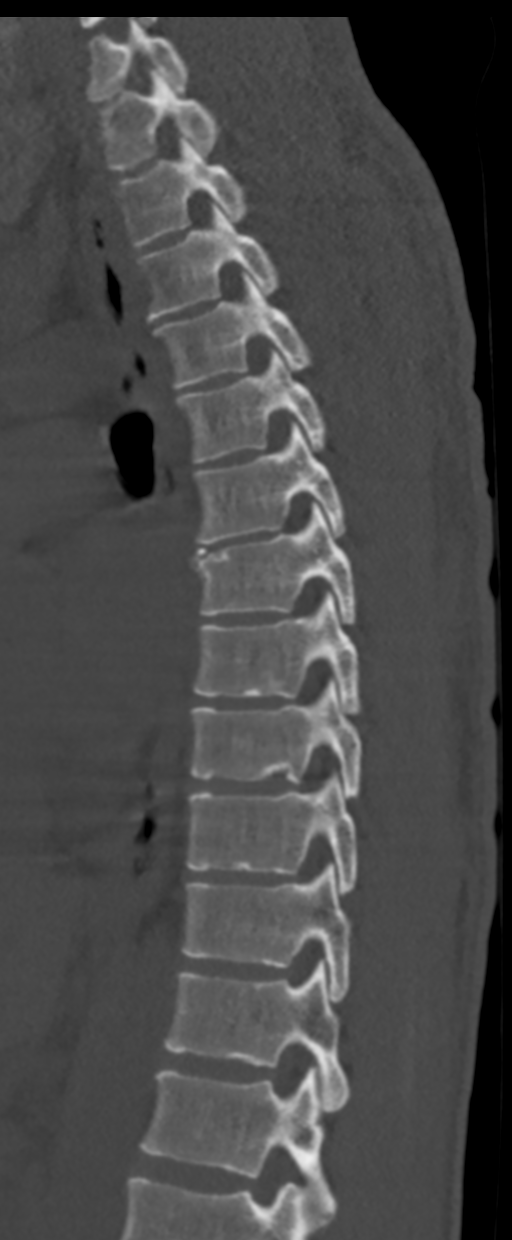
[im 41/61  bone]
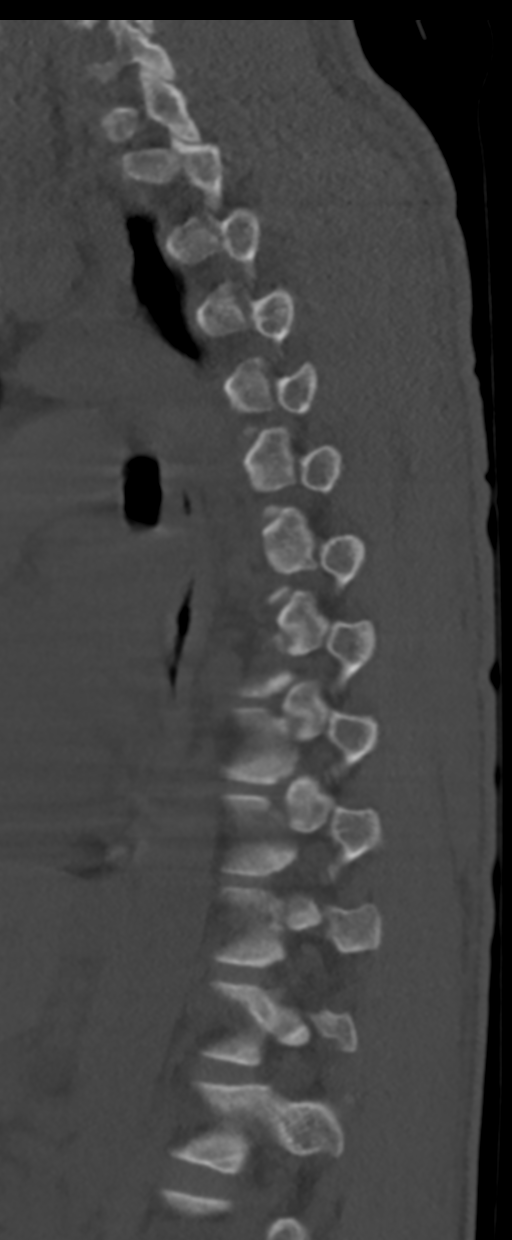

[8 of 33 positions shown; findings below may reference images not displayed]

FINDINGS: Alignment: Normal.

Vertebrae: Small superior endplate Schmorl's nodes at T7 anteriorly.
No acute vertebral fracture or focal pathologic lesion.

Paraspinal and other soft tissues: Negative. No retroperitoneal
adenopathy. No aortic aneurysm. There is dependent atelectasis
bilaterally.

Disc levels: No spinal stenosis or significant neural foraminal
encroachment.
IMPRESSION: No acute osseous abnormality of the thoracic spine.
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
## 2017-06-14 IMAGING — CT CT L SPINE W/O CM
2 of 4 series · 13 of 33 positions shown, 16 images · non-contrast
Comparison: None.

CLINICAL DATA: Back pain after syncopal episode in shower.

EXAM:
CT LUMBAR SPINE WITHOUT CONTRAST
TECHNIQUE: Multidetector CT imaging of the lumbar spine was performed without
intravenous contrast administration. Multiplanar CT image
reconstructions were also generated.

[Series 5: l spine 2.0 st · axial · 0.37mm/px · z∈[-772,-516]mm · 10 of 152 slices shown, 13 images]
[im 12/152  soft-tissue]
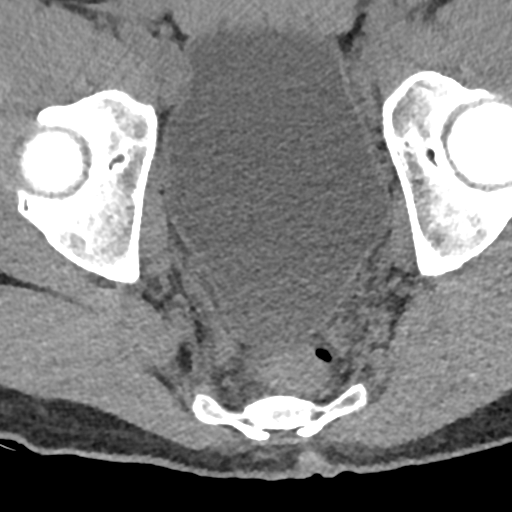
[im 12/152  bone]
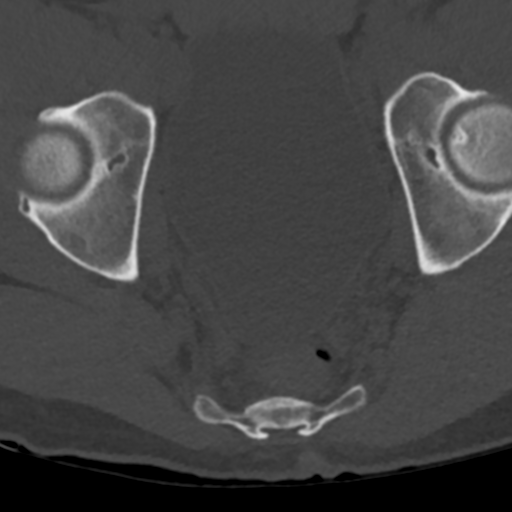
[im 24/152  bone]
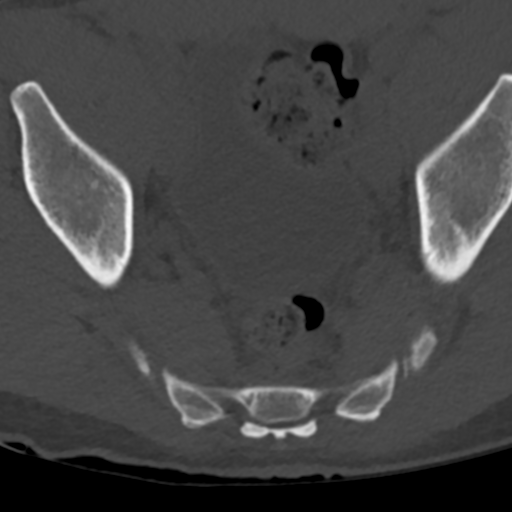
[im 47/152  bone]
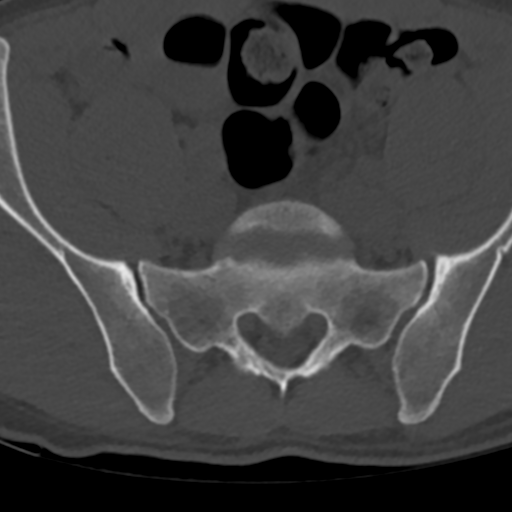
[im 59/152  bone]
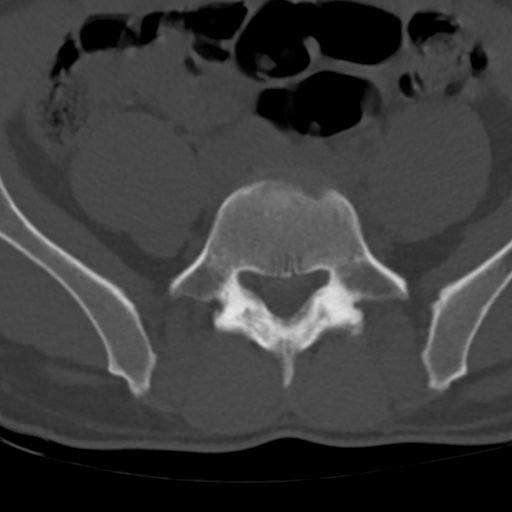
[im 70/152  soft-tissue]
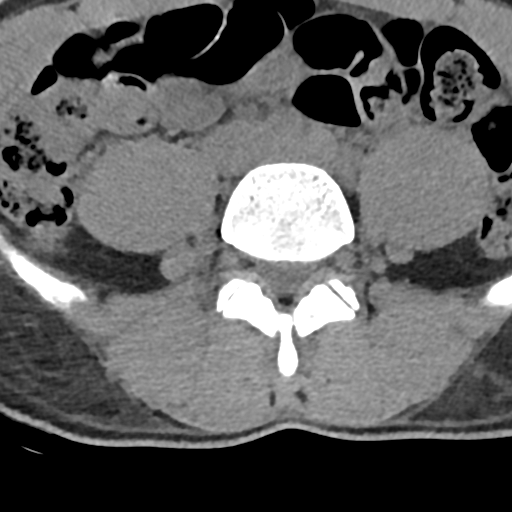
[im 70/152  bone]
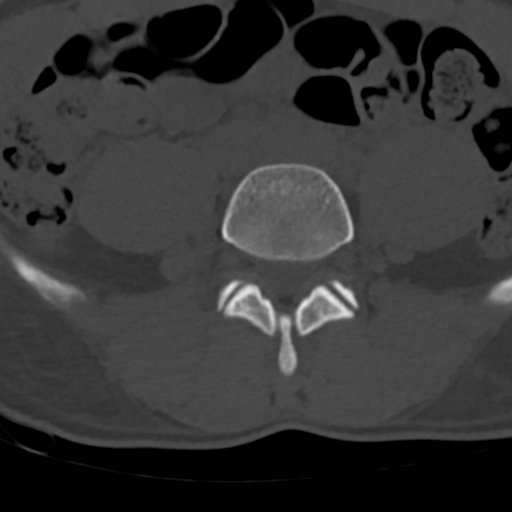
[im 82/152  bone]
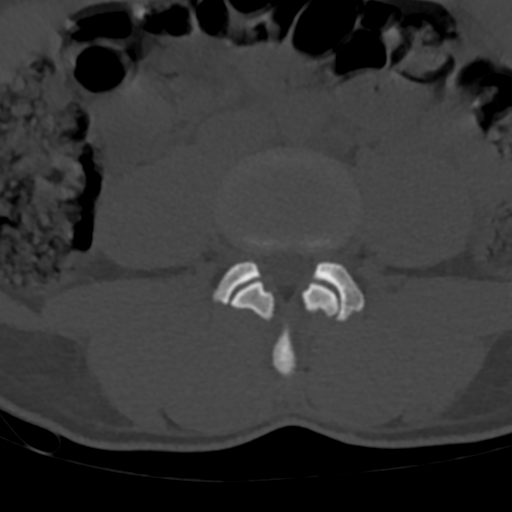
[im 93/152  bone]
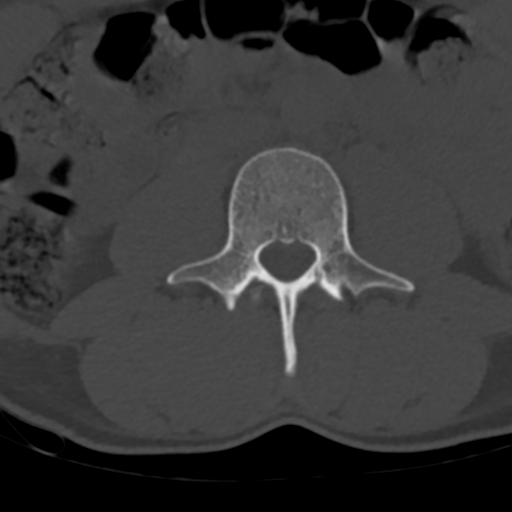
[im 117/152  bone]
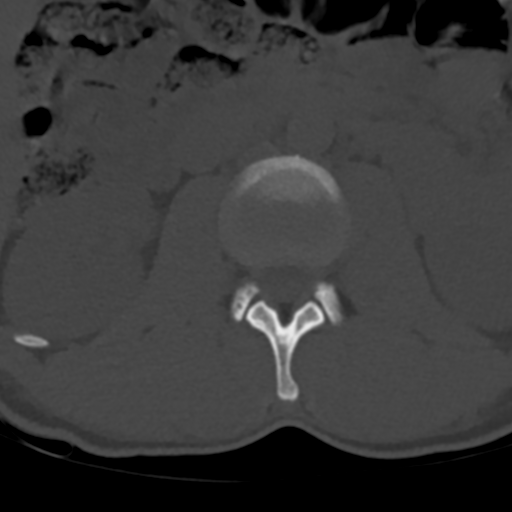
[im 128/152  soft-tissue]
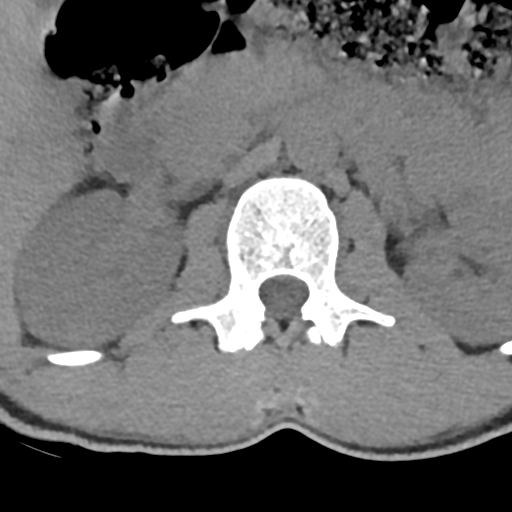
[im 128/152  bone]
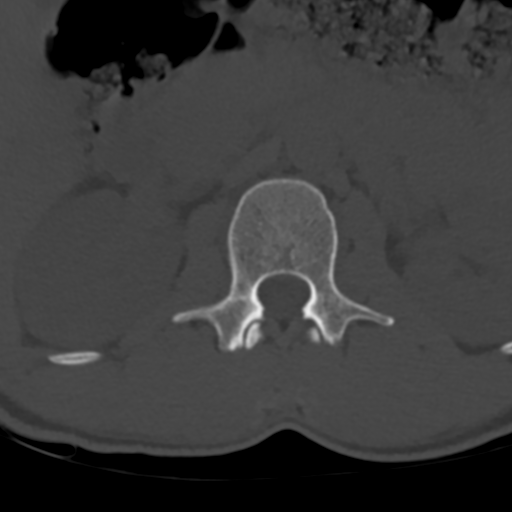
[im 140/152  bone]
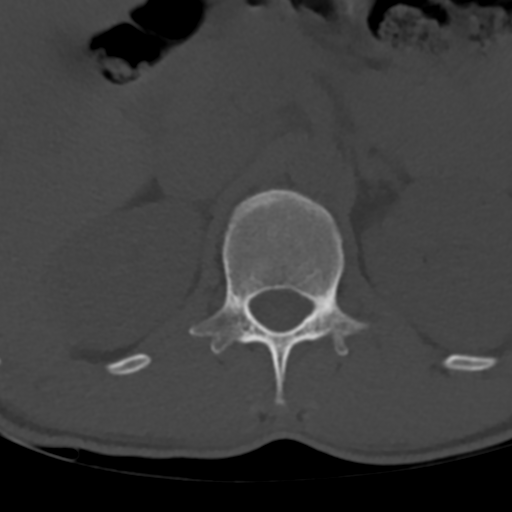

[Series 8: coronal st · coronal · 0.27mm/px · 3 of 70 slices shown]
[im 14/70  bone]
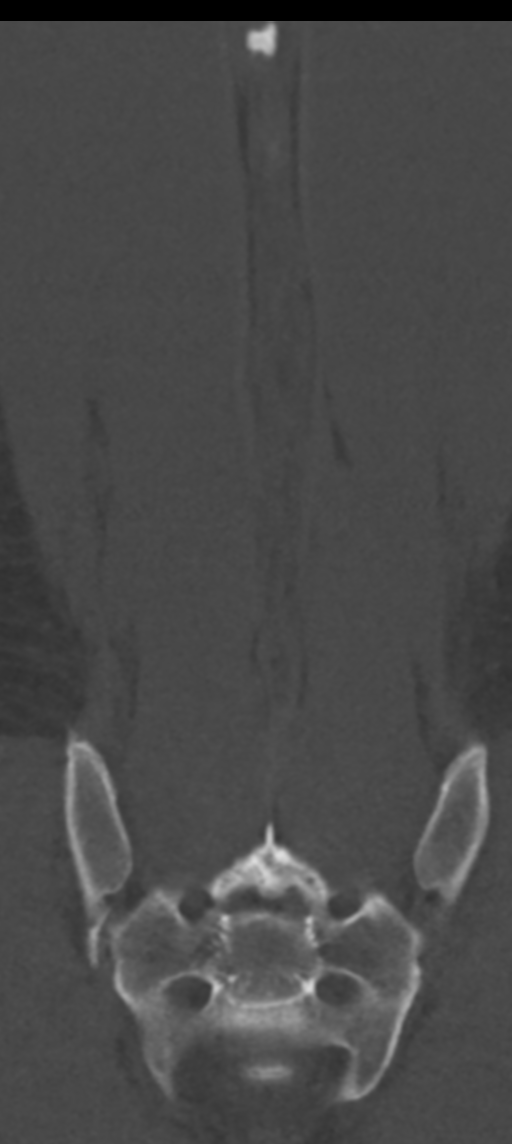
[im 28/70  bone]
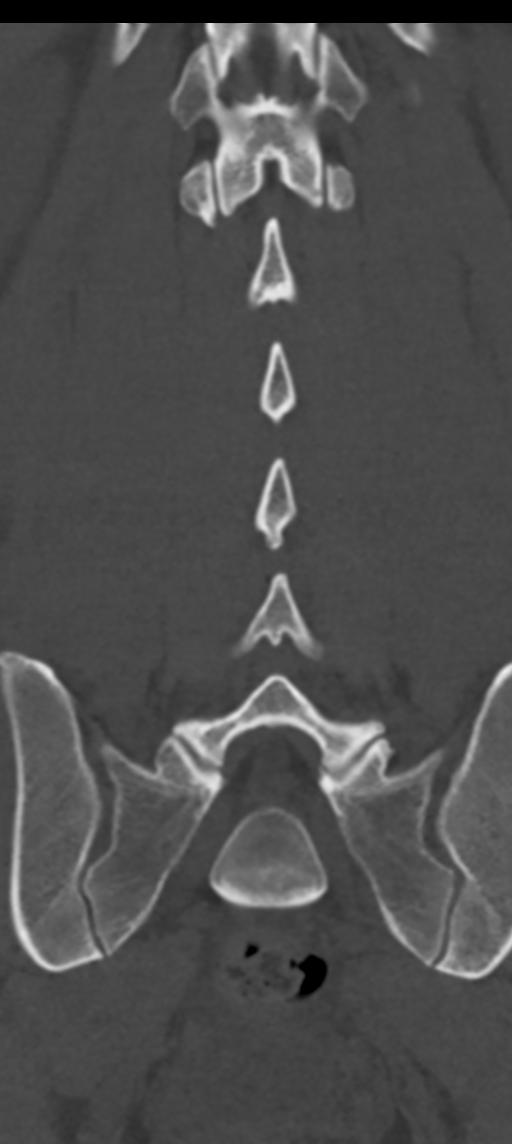
[im 42/70  bone]
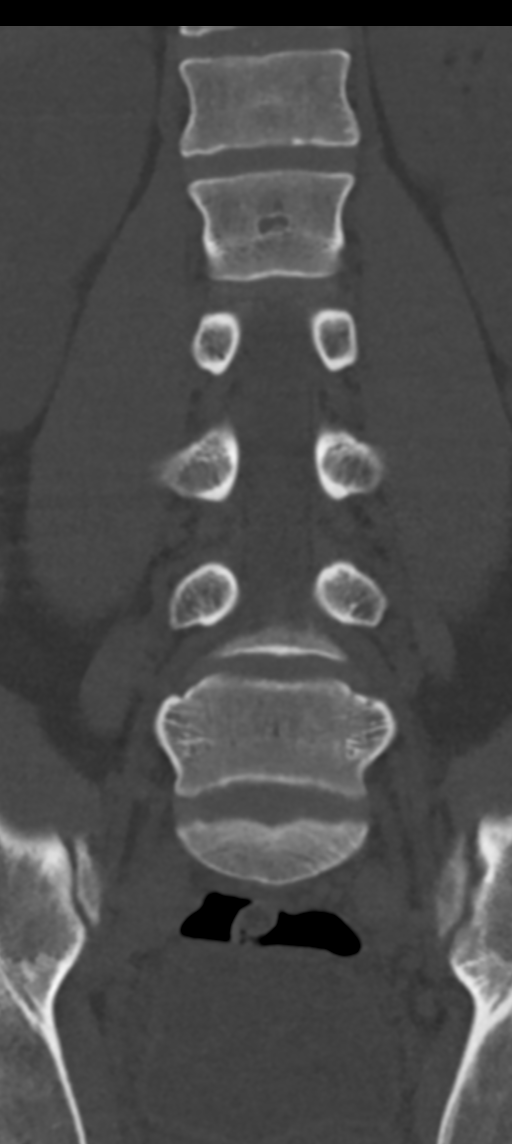

[13 of 33 positions shown; findings below may reference images not displayed]

FINDINGS: Segmentation: 5 lumbar type vertebrae.

Alignment: Normal.

Vertebrae: No acute fracture or focal pathologic process. The intact
sacroiliac joints and arcuate lines of the sacrum.

Paraspinal and other soft tissues: Negative.

Disc levels: Mild-to-moderate broad-based disc bulge L4-5 and L5-S1.
No significant neural foraminal or central canal stenosis.
IMPRESSION: 1. No acute lumbar spine fracture or listhesis.
2. Mild-to-moderate posterior broad-based disc bulges at L4-5 and
L5-S1.

## 2017-06-14 MED ORDER — LORAZEPAM 2 MG/ML IJ SOLN
2.0000 mg | Freq: Once | INTRAMUSCULAR | Status: AC
  Administered 2017-06-14: 2 mg via INTRAVENOUS
  Filled 2017-06-14: qty 1

## 2017-06-14 MED ORDER — IBUPROFEN 800 MG PO TABS
800.0000 mg | ORAL_TABLET | Freq: Once | ORAL | Status: AC
  Administered 2017-06-14: 800 mg via ORAL
  Filled 2017-06-14: qty 1

## 2017-06-14 MED ORDER — ONDANSETRON HCL 4 MG/2ML IJ SOLN
4.0000 mg | Freq: Once | INTRAMUSCULAR | Status: AC
  Administered 2017-06-14: 4 mg via INTRAVENOUS
  Filled 2017-06-14: qty 2

## 2017-06-14 MED ORDER — SODIUM CHLORIDE 0.9 % IV BOLUS (SEPSIS): 1000.0000 mL | Freq: Once | INTRAVENOUS | Status: DC

## 2017-06-14 NOTE — ED Triage Notes (Signed)
Pt here after falling in the shower in jail. States he "went out" and doesn't remember event. No obvious trauma. Pt c/o back pain and head pain. Pt with [employer] officer when brought in by EMS and in shackles. Sores noticed all over pts body and pt states he "shared needles with HIV known person"

## 2017-06-14 NOTE — ED Provider Notes (Signed)
[facility]   CSN: [id number] Arrival date & time: 06/14/17  2112     History   Chief Complaint Chief Complaint  Patient presents with  . Fall    HPI Adam Donovan is a 37 y.o. male.  HPI   37 year old male brought here in custody of [employer] for evaluation of a fall.  Patient with no history of IV drug use, including meth, alcohol, Suboxone who has been incarcerated for the past several days.  He mentioned that he is having withdrawal symptoms including feeling shaky, lightheadedness and today while showering he had a syncopal episode with a loss of consciousness approximately 32nd.  He was found the floor.  He was complaining of pain throughout his entire back from the fall.  He attributed his fall to having withdrawal.  No history of seizures in the past.  Currently complaining of "everywhere".  States last drug use was 2-3 days ago.  He also complaining of skin lesion throughout his entire body for the past several days.  States it is painful and not itchy.  He does not know how it came about but denies skin picking.  At this time he does  complain of chest pain shortness of breath, chest tightness, along with some nausea.  Denies dysuria or diarrhea.   Past Medical History:  Diagnosis Date  . Alcohol abuse    alcohol withdrawal  . IV drug abuse (HCC)     There are no active problems to display for this patient.   Past Surgical History:  Procedure Laterality Date  . APPENDECTOMY         Home Medications    Prior to Admission medications   Medication Sig Start Date End Date Taking? Authorizing Provider  ibuprofen (ADVIL,MOTRIN) 600 MG tablet Take 1 tablet (600 mg total) by mouth every 6 (six) hours as needed. Patient not taking: Reported on 04/13/2017 07/26/15   Burgess AmorIdol, Julie, PA-C  traMADol (ULTRAM) 50 MG tablet Take 1 tablet (50 mg total) by mouth every 6 (six) hours as needed. Patient not taking: Reported on  04/13/2017 07/26/15   Burgess AmorIdol, Julie, PA-C    Family History No family history on file.  Social History Social History   Tobacco Use  . Smoking status: Current Every Day Smoker    Packs/day: 2.00    Types: Cigarettes  . Smokeless tobacco: Never Used  Substance Use Topics  . Alcohol use: Yes    Comment: every other day  . Drug use: No    Comment: saboxone     Allergies   Patient has no known allergies.   Review of Systems Review of Systems  All other systems reviewed and are negative.    Physical Exam Updated Vital Signs There were no vitals taken for this visit.  Physical Exam  Constitutional: He is oriented to person, place, and time. He appears well-developed and well-nourished.  Patient laying in fetal position, sleeping but easily arousable.  HENT:  Head: Atraumatic.  Mouth/Throat: Oropharynx is clear and moist.  Eyes: Conjunctivae and EOM are normal. Pupils are equal, round, and reactive to light.  Neck: Neck supple.  No nuchal rigidity  Cardiovascular: Normal rate and regular rhythm.  Pulmonary/Chest: Effort normal and breath sounds normal.  Abdominal: Soft. He exhibits no distension. There is no tenderness.  Musculoskeletal: He exhibits tenderness (Diffuse tenderness throughout and tries back on gentle palpation without focal point tenderness.).  Neurological: He is alert and oriented to  person, place, and time.  Skin: Rash (Multiple shallow ulcerated skin changes approximately 3mm to 1 cm noted throughout body including face, neck, back, chest, arms.  No rash in the palms of hands or soles of feet.  No oral mucosal involvement.) noted.  Psychiatric: He has a normal mood and affect.  Nursing note and vitals reviewed.    ED Treatments / Results  Labs (all labs ordered are listed, but only abnormal results are displayed) Labs Reviewed  CBC WITH DIFFERENTIAL/PLATELET  COMPREHENSIVE METABOLIC PANEL  ETHANOL  RAPID URINE DRUG SCREEN, HOSP PERFORMED     EKG  EKG Interpretation None     ED ECG REPORT   Date: 06/14/2017  Rate: 68  Rhythm: normal sinus rhythm  QRS Axis: normal  Intervals: normal  ST/T Wave abnormalities: nonspecific T wave changes  Conduction Disutrbances:none  Narrative Interpretation:   Old EKG Reviewed: unchanged  I have personally reviewed the EKG tracing and agree with the computerized printout as noted.   Radiology Ct Head Wo Contrast  Result Date: 06/14/2017 CLINICAL DATA:  37 year old male with trauma. EXAM: CT HEAD WITHOUT CONTRAST CT CERVICAL SPINE WITHOUT CONTRAST TECHNIQUE: Multidetector CT imaging of the head and cervical spine was performed following the standard protocol without intravenous contrast. Multiplanar CT image reconstructions of the cervical spine were also generated. COMPARISON:  None. FINDINGS: CT HEAD FINDINGS Brain: No evidence of acute infarction, hemorrhage, hydrocephalus, extra-axial collection or mass lesion/mass effect. Vascular: No hyperdense vessel or unexpected calcification. Skull: Normal. Negative for fracture or focal lesion. Sinuses/Orbits: No acute finding. Other: None CT CERVICAL SPINE FINDINGS Alignment: Normal. Skull base and vertebrae: No acute fracture. No primary bone lesion or focal pathologic process. Soft tissues and spinal canal: No prevertebral fluid or swelling. No visible canal hematoma. Disc levels:  No acute findings.  No degenerative changes. Upper chest: Negative. Other: None IMPRESSION: 1. Normal unenhanced CT of the brain. 2. No acute/traumatic cervical spine pathology. Electronically Signed   By: Elgie Collard M.D.   On: 06/14/2017 22:25   Ct Cervical Spine Wo Contrast  Result Date: 06/14/2017 CLINICAL DATA:  37 year old male with trauma. EXAM: CT HEAD WITHOUT CONTRAST CT CERVICAL SPINE WITHOUT CONTRAST TECHNIQUE: Multidetector CT imaging of the head and cervical spine was performed following the standard protocol without intravenous contrast.  Multiplanar CT image reconstructions of the cervical spine were also generated. COMPARISON:  None. FINDINGS: CT HEAD FINDINGS Brain: No evidence of acute infarction, hemorrhage, hydrocephalus, extra-axial collection or mass lesion/mass effect. Vascular: No hyperdense vessel or unexpected calcification. Skull: Normal. Negative for fracture or focal lesion. Sinuses/Orbits: No acute finding. Other: None CT CERVICAL SPINE FINDINGS Alignment: Normal. Skull base and vertebrae: No acute fracture. No primary bone lesion or focal pathologic process. Soft tissues and spinal canal: No prevertebral fluid or swelling. No visible canal hematoma. Disc levels:  No acute findings.  No degenerative changes. Upper chest: Negative. Other: None IMPRESSION: 1. Normal unenhanced CT of the brain. 2. No acute/traumatic cervical spine pathology. Electronically Signed   By: Elgie Collard M.D.   On: 06/14/2017 22:25   Ct Thoracic Spine Wo Contrast  Result Date: 06/14/2017 CLINICAL DATA:  Syncopal episode with fall in shower.  Back pain. EXAM: CT THORACIC SPINE WITHOUT CONTRAST TECHNIQUE: Multidetector CT images of the thoracic were obtained using the standard protocol without intravenous contrast. COMPARISON:  None. FINDINGS: Alignment: Normal. Vertebrae: Small superior endplate Schmorl's nodes at T7 anteriorly. No acute vertebral fracture or focal pathologic lesion. Paraspinal and other soft tissues: Negative.  No retroperitoneal adenopathy. No aortic aneurysm. There is dependent atelectasis bilaterally. Disc levels: No spinal stenosis or significant neural foraminal encroachment. IMPRESSION: No acute osseous abnormality of the thoracic spine. Electronically Signed   By: Tollie Eth M.D.   On: 06/14/2017 22:24   Ct Lumbar Spine Wo Contrast  Result Date: 06/14/2017 CLINICAL DATA:  Back pain after syncopal episode in shower. EXAM: CT LUMBAR SPINE WITHOUT CONTRAST TECHNIQUE: Multidetector CT imaging of the lumbar spine was performed  without intravenous contrast administration. Multiplanar CT image reconstructions were also generated. COMPARISON:  None. FINDINGS: Segmentation: 5 lumbar type vertebrae. Alignment: Normal. Vertebrae: No acute fracture or focal pathologic process. The intact sacroiliac joints and arcuate lines of the sacrum. Paraspinal and other soft tissues: Negative. Disc levels: Mild-to-moderate broad-based disc bulge L4-5 and L5-S1. No significant neural foraminal or central canal stenosis. IMPRESSION: 1. No acute lumbar spine fracture or listhesis. 2. Mild-to-moderate posterior broad-based disc bulges at L4-5 and L5-S1. Electronically Signed   By: Tollie Eth M.D.   On: 06/14/2017 22:21    Procedures Procedures (including critical care time)  Medications Ordered in ED Medications  sodium chloride 0.9 % bolus 1,000 mL (not administered)  ondansetron (ZOFRAN) injection 4 mg (not administered)  LORazepam (ATIVAN) injection 2 mg (not administered)     Initial Impression / Assessment and Plan / ED Course  I have reviewed the triage vital signs and the nursing notes.  Pertinent labs & imaging results that were available during my care of the patient were reviewed by me and considered in my medical decision making (see chart for details).     BP 132/78   Pulse 70   Temp 97.9 F (36.6 C) (Oral)   Resp 15   Wt 90.7 kg (200 lb)   SpO2 100%   BMI 27.89 kg/m    Final Clinical Impressions(s) / ED Diagnoses   Final diagnoses:  Opiate withdrawal (HCC)  Generalized skin lesions  Syncope and collapse    ED Discharge Orders        Ordered    ibuprofen (ADVIL,MOTRIN) 600 MG tablet  Every 6 hours PRN     06/15/17 0029    carbamazepine (TEGRETOL) 200 MG tablet     06/15/17 0029    doxycycline (VIBRAMYCIN) 100 MG capsule  2 times daily     06/15/17 0029     11:02 PM Patient report having a syncopal episode while in the bathroom and attributed to withdrawal from his drugs.  He is complaining of pain  to his head neck back throughout.  CT scan of head cervical spine thoracic and lumbar spine show no acute abnormalities. EKG without concerning arrhythmia.  Will perform screening lab tests.  Pt current mentating appropriately.  Vital sign stable.  Request for food to eat  12:26 AM Pt tolerates PO.  No focal neuro deficits.  I have discussed with Dr. Adriana Simas and plan to d/c pt with tegretol taper course to help with withdrawal.  I will also prescribe doxycycline for skin lesion due to potential skin infections.    Fayrene Helper, PA-C 06/15/17 Marlyce Huge    Donnetta Hutching, MD 06/15/17 [phone]

## 2017-06-14 NOTE — ED Notes (Signed)
Pt back from CT

## 2017-06-15 LAB — CBC WITH DIFFERENTIAL/PLATELET
BASOS PCT: 0 %
Basophils Absolute: 0 10*3/uL (ref 0.0–0.1)
EOS ABS: 0.1 10*3/uL (ref 0.0–0.7)
Eosinophils Relative: 2 %
HEMATOCRIT: 43.6 % (ref 39.0–52.0)
HEMOGLOBIN: 15.1 g/dL (ref 13.0–17.0)
Lymphocytes Relative: 25 %
Lymphs Abs: 2.1 10*3/uL (ref 0.7–4.0)
MCH: 30.1 pg (ref 26.0–34.0)
MCHC: 34.6 g/dL (ref 30.0–36.0)
MCV: 86.9 fL (ref 78.0–100.0)
Monocytes Absolute: 1 10*3/uL (ref 0.1–1.0)
Monocytes Relative: 12 %
NEUTROS ABS: 5 10*3/uL (ref 1.7–7.7)
NEUTROS PCT: 61 %
Platelets: 312 10*3/uL (ref 150–400)
RBC: 5.02 MIL/uL (ref 4.22–5.81)
RDW: 12.9 % (ref 11.5–15.5)
WBC: 8.2 10*3/uL (ref 4.0–10.5)

## 2017-06-15 LAB — COMPREHENSIVE METABOLIC PANEL
ALBUMIN: 3.6 g/dL (ref 3.5–5.0)
ALT: 75 U/L — ABNORMAL HIGH (ref 17–63)
ANION GAP: 8 (ref 5–15)
AST: 41 U/L (ref 15–41)
Alkaline Phosphatase: 94 U/L (ref 38–126)
BILIRUBIN TOTAL: 0.6 mg/dL (ref 0.3–1.2)
BUN: 6 mg/dL (ref 6–20)
CO2: 25 mmol/L (ref 22–32)
Calcium: 8.9 mg/dL (ref 8.9–10.3)
Chloride: 102 mmol/L (ref 101–111)
Creatinine, Ser: 0.83 mg/dL (ref 0.61–1.24)
GFR calc Af Amer: >60 mL/min (ref >60)
GFR calc non Af Amer: >60 mL/min (ref >60)
GLUCOSE: 98 mg/dL (ref 65–99)
POTASSIUM: 3.7 mmol/L (ref 3.5–5.1)
SODIUM: 135 mmol/L (ref 135–145)
TOTAL PROTEIN: 7.5 g/dL (ref 6.5–8.1)

## 2017-06-15 LAB — ETHANOL: Alcohol, Ethyl (B): <10 mg/dL (ref <10)

## 2017-06-15 MED ORDER — DOXYCYCLINE HYCLATE 100 MG PO CAPS: 100.0000 mg | ORAL_CAPSULE | Freq: Two times a day (BID) | ORAL | 0 refills | Status: DC

## 2017-06-15 MED ORDER — CARBAMAZEPINE 200 MG PO TABS: ORAL_TABLET | ORAL | 0 refills | Status: AC

## 2017-06-15 MED ORDER — IBUPROFEN 600 MG PO TABS: 600.0000 mg | ORAL_TABLET | Freq: Four times a day (QID) | ORAL | 0 refills | Status: DC | PRN

## 2017-06-15 NOTE — Discharge Instructions (Signed)
You have been evaluated for your recent loss of consciousness.  Fortunately no evidence of any significant injuries were noted on today's exam.  Take ibuprofen as needed for pain.  Take Tegretol taper course to help with withdrawal.  You have multiple skin lesions which may increase risk of skin infection.  Please take doxycycline as prescribe to prevent skin infection.  Avoid substance use as it will negatively affect your health.

## 2017-06-15 NOTE — ED Notes (Signed)
Pt discharged from ED; instructions provided and scripts given; Pt encouraged to return to ED if symptoms worsen and to f/u with PCP; Pt verbalized understanding of all instructions 

## 2017-08-10 ENCOUNTER — Encounter: Payer: Self-pay | Admitting: Infectious Disease

## 2017-09-24 ENCOUNTER — Emergency Department
Admission: EM | Admit: 2017-09-24 | Discharge: 2017-09-24 | Disposition: A | Discharge status: Home / Self Care | Payer: Medicaid Other | Attending: Emergency Medicine | Admitting: Emergency Medicine

## 2017-09-24 ENCOUNTER — Other Ambulatory Visit: Payer: Self-pay

## 2017-09-24 DIAGNOSIS — F1010 Alcohol abuse, uncomplicated: Secondary | ICD-10-CM | POA: Insufficient documentation

## 2017-09-24 DIAGNOSIS — F17210 Nicotine dependence, cigarettes, uncomplicated: Secondary | ICD-10-CM | POA: Diagnosis not present

## 2017-09-24 DIAGNOSIS — F22 Delusional disorders: Secondary | ICD-10-CM | POA: Diagnosis not present

## 2017-09-24 DIAGNOSIS — F99 Mental disorder, not otherwise specified: Secondary | ICD-10-CM | POA: Diagnosis present

## 2017-09-24 LAB — CBC WITH DIFFERENTIAL/PLATELET
BASOS ABS: 0.1 10*3/uL (ref 0–0.1)
BASOS PCT: 1 %
Eosinophils Absolute: 0.2 10*3/uL (ref 0–0.7)
Eosinophils Relative: 2 %
HEMATOCRIT: 42.5 % (ref 40.0–52.0)
HEMOGLOBIN: 14.6 g/dL (ref 13.0–18.0)
LYMPHS PCT: 27 %
Lymphs Abs: 2.3 10*3/uL (ref 1.0–3.6)
MCH: 30.7 pg (ref 26.0–34.0)
MCHC: 34.3 g/dL (ref 32.0–36.0)
MCV: 89.6 fL (ref 80.0–100.0)
MONO ABS: 1 10*3/uL (ref 0.2–1.0)
Monocytes Relative: 12 %
NEUTROS ABS: 5 10*3/uL (ref 1.4–6.5)
NEUTROS PCT: 58 %
Platelets: 341 10*3/uL (ref 150–440)
RBC: 4.74 MIL/uL (ref 4.40–5.90)
RDW: 14.1 % (ref 11.5–14.5)
WBC: 8.6 10*3/uL (ref 3.8–10.6)

## 2017-09-24 LAB — COMPREHENSIVE METABOLIC PANEL
ALBUMIN: 4.5 g/dL (ref 3.5–5.0)
ALT: 46 U/L (ref 17–63)
AST: 45 U/L — ABNORMAL HIGH (ref 15–41)
Alkaline Phosphatase: 70 U/L (ref 38–126)
Anion gap: 10 (ref 5–15)
BILIRUBIN TOTAL: 1.7 mg/dL — AB (ref 0.3–1.2)
BUN: 21 mg/dL — ABNORMAL HIGH (ref 6–20)
CALCIUM: 9.2 mg/dL (ref 8.9–10.3)
CO2: 25 mmol/L (ref 22–32)
Chloride: 100 mmol/L — ABNORMAL LOW (ref 101–111)
Creatinine, Ser: 1.08 mg/dL (ref 0.61–1.24)
GFR calc non Af Amer: >60 mL/min (ref >60)
GLUCOSE: 108 mg/dL — AB (ref 65–99)
POTASSIUM: 4 mmol/L (ref 3.5–5.1)
SODIUM: 135 mmol/L (ref 135–145)
TOTAL PROTEIN: 8.2 g/dL — AB (ref 6.5–8.1)

## 2017-09-24 LAB — ACETAMINOPHEN LEVEL

## 2017-09-24 LAB — SALICYLATE LEVEL: Salicylate Lvl: <7 mg/dL (ref 2.8–30.0)

## 2017-09-24 LAB — ETHANOL

## 2017-09-24 NOTE — ED Provider Notes (Signed)
[facility] Emergency Department Provider Note  ____________________________________________  Time seen: Approximately 2:57 PM  I have reviewed the triage vital signs and the nursing notes.   HISTORY  Chief Complaint Skin Ulcer    HPI Adam Donovan is a 37 y.o. male who presents the emergency department complaining of bug bites.  Patient initially presented to the nurse complaining of bugs "underneath my skin."  Patient reports that he could feel that they were crawling, and could seeing bugs appearing through open wounds on his body.  Patient has been scratching it same.  Patient tried to show the RN potential bugs, the RN visualized Lint and scab that the patient had removed from his body.  On my presentation to the room, the patient initially denied any bug bites but endorsed open sores that had spontaneously ruptured on his body.  As I discussed with the patient has symptoms, patient became increasingly agitated until he began furiously scratching at several open sores on his body.  Patient asked whether I had seen the bugs "popping in and out of my skin."  Patient began to furiously digging at 1 of his open lesions.  He verbalized to me that "I am was had that one."  Patient has excoriations covering his scalp, neck, chest, bilateral upper extremities, genital region, bilateral lower extremities.  Patient was pointing out multiple areas that were "bugs" that I visualized that were either scabs, dry skin, or no visible abnormality whatsoever.  Patient has pressured speech.  His eyes are pinpoint.  Patient states that he has taken half of a Suboxone today but on review of the [place] controlled substance database, patient's last prescription for sublingual Suboxone was from July 2018.  Patient with limited insight, pressured speech.  Patient denies any thoughts of suicide, but has stated to me repeatedly that I just cannot take this anymore, it is driving me  crazy, I just cannot take much more of this.  Patient denies any plans to harm himself or others over this condition.  Patient has a history of alcohol abuse, alcohol withdrawal, IV drug abuse.  Patient denies complaints with these issues at this time.  Past Medical History:  Diagnosis Date  . Alcohol abuse    alcohol withdrawal  . IV drug abuse (HCC)     There are no active problems to display for this patient.   Past Surgical History:  Procedure Laterality Date  . APPENDECTOMY      Prior to Admission medications   Medication Sig Start Date End Date Taking? Authorizing Provider  carbamazepine (TEGRETOL) 200 MG tablet 800mg  PO QD X 1D, then 600mg  PO QD X 1D, then 400mg  QD X 1D, then 200mg  PO QD X 2D 06/15/17   Fayrene Helper, PA-C  doxycycline (VIBRAMYCIN) 100 MG capsule Take 1 capsule (100 mg total) by mouth 2 (two) times daily. One po bid x 7 days 06/15/17   Fayrene Helper, PA-C  ibuprofen (ADVIL,MOTRIN) 600 MG tablet Take 1 tablet (600 mg total) by mouth every 6 (six) hours as needed for moderate pain. 06/15/17   Fayrene Helper, PA-C  traMADol (ULTRAM) 50 MG tablet Take 1 tablet (50 mg total) by mouth every 6 (six) hours as needed. Patient not taking: Reported on 04/13/2017 07/26/15   Burgess Amor, PA-C    Allergies Patient has no known allergies.  No family history on file.  Social History Social History   Tobacco Use  . Smoking status: Current Every Day Smoker  Packs/day: 2.00    Types: Cigarettes  . Smokeless tobacco: Never Used  Substance Use Topics  . Alcohol use: Yes    Comment: every other day  . Drug use: No    Types: IV, Cocaine    Comment: saboxone     Review of Systems  Constitutional: No fever/chills Eyes: No visual changes. No discharge ENT: No upper respiratory complaints. Cardiovascular: no chest pain. Respiratory: no cough. No SOB. Gastrointestinal: No abdominal pain.  No nausea, no vomiting.  No diarrhea.  No constipation. Genitourinary: Negative for  dysuria. No hematuria Musculoskeletal: Negative for musculoskeletal pain. Skin: Patient complains of multiple open sores scattered across his body.  Patient reports having an infestation of bugs underneath his skin. Neurological: Negative for headaches, focal weakness or numbness. Psychological: Patient denies any definitive thoughts of self-harm and no thoughts of harming others. 10-point ROS otherwise negative.  ____________________________________________   PHYSICAL EXAM:  VITAL SIGNS: ED Triage Vitals [09/24/17 1451]  Enc Vitals Group     BP      Pulse      Resp      Temp      Temp src      SpO2      Weight 200 lb (90.7 kg)     Height 5\' 11"  (1.803 m)     Head Circumference      Peak Flow      Pain Score 0     Pain Loc      Pain Edu?      Excl. in GC?      Constitutional: Alert and oriented. Well appearing and in no acute distress. Eyes: Conjunctivae are normal.  Pupils are pinpoint, equal and round.. EOMI. Head: Atraumatic.  Multiple open sores consistent with excoriations from scratching.  No surrounding erythema or edema. ENT:      Ears: EACs and TMs unremarkable bilaterally      nose: No congestion/rhinnorhea.      Mouth/Throat: Mucous membranes are moist.  Neck: No stridor.  Neck is supple full range of motion Hematological/Lymphatic/Immunilogical: No cervical lymphadenopathy. Cardiovascular: Normal rate, regular rhythm. Normal S1 and S2.  Good peripheral circulation. Respiratory: Normal respiratory effort without tachypnea or retractions. Lungs CTAB. Good air entry to the bases with no decreased or absent breath sounds. Musculoskeletal: Full range of motion to all extremities. No gross deformities appreciated. Neurologic:  Normal speech and language. No gross focal neurologic deficits are appreciated.  Skin:  Skin is warm, dry and intact.  Patient with scattered open skin lesions to the scalp, neck, trunk, upper and lower extremities.  Patient also has  excoriations to the genitals.  Visualization reveals no erythema or edema surrounding lesions consistent with cellulitis.  No pustular drainage.  Patient has excoriated lesions to the point of bleeding.  Patient was picking at scabbing on my arrival with minor bleeding from several lesions.  No visible findings consistent with insect infestation, dermal or otherwise.  No visible findings consistent with bug bites either. Psychiatric: Patient appears very anxious, agitated.Marland Kitchen. Speech speech is pressured.. Patient exhibits limited insight and judgement.  Patient initially denied complaint of "bugs" to myself, during interview, patient became extremely agitated until he could not control aggressive and significant scratching at what to him were visualized bugs. No insects or findings consistent with insects were appreciated to areas patient was aggressively scratching.   ____________________________________________   LABS (all labs ordered are listed, but only abnormal results are displayed)  Labs Reviewed  URINALYSIS, COMPLETE ([facility]MP) WITH  MICROSCOPIC  URINE DRUG SCREEN, QUALITATIVE ([facility] ONLY)  COMPREHENSIVE METABOLIC PANEL  CBC WITH DIFFERENTIAL/PLATELET  SALICYLATE LEVEL  ACETAMINOPHEN LEVEL  ETHANOL   ____________________________________________  EKG   ____________________________________________  RADIOLOGY   No results found.  ____________________________________________    PROCEDURES  Procedure(s) performed:    Procedures    Medications - No data to display   ____________________________________________   INITIAL IMPRESSION / ASSESSMENT AND PLAN / ED COURSE  Pertinent labs & imaging results that were available during my care of the patient were reviewed by me and considered in my medical decision making (see chart for details).  Review of the  [place] was performed in accordance of the [facility] prior to dispensing any controlled drugs.  Clinical Course as of Sep 24 1528  Mon Sep 24, 2017  1526 Patient presented to the emergency department complaining of bug bites.  Patient also reported having bugs crawling underneath his skin.  Patient initially denied this complaint with myself but throughout the interview, patient became excessively agitated until he was unable to prevent himself from scratching at with to him or visible bugs.  Patient even asked me whether I could see the bugs that were "popping in and out" of the open skin lesions.  I did not visualize any findings consistent with a bug infestation or bug bites.  Patient has significant excoriations throughout his body from scratching.  Patient denies any suicidal or homicidal ideation, however I feel that this is largely a psychiatric component.  I discussed the patient history, physical exam with attending provider, Dr. Don Perking.  She agrees that this is likely largely psychiatric.  At this time, basic blood work will be obtained.  Patient's pupils are pinpoint and he admits to taking half of Suboxone.  When queried the controlled substance database however, patient's last prescription for Suboxone was 9 months ago.  Patient does have a significant alcohol abuse and illicit drug use history.  At this time, patient will be transferred to the major side of the emergency department for evaluation by psychiatry.   [JC]    Clinical Course User Index [JC] Cuthriell, Delorise Royals, PA-C    Patient care is transferred to Dr. Don Perking who will handle further patient care.  Patient diagnosis and disposition will be provided by attending provider.     This chart was dictated using voice recognition software/Dragon. Despite best efforts to proofread, errors can occur which can change the meaning. Any change was purely unintentional.    Racheal Patches, PA-C 09/24/17 1531    Don Perking, [place], MD 09/24/17 [phone]

## 2017-09-24 NOTE — ED Triage Notes (Signed)
[facility] for c/o bed bugs. Pt with numerous skin sores in various stages of healing. Pt is convinced that there are bugs under his skin. Pt pulled off 2 bugs from between his toes and showed them to me. One was a piece of scab and the other was a lint ball. Pt states that he started to have bugs 1 month ago. Pt also states that the bugs are attracted to certain smells and that one night he slept in a port-a-john and woke up covered with black bugs head to toe.

## 2017-09-24 NOTE — Discharge Instructions (Addendum)
You have been seen in the Emergency Department (ED)  today for a psychiatric complaint.  You have been evaluated by psychiatry and we believe you are safe to be discharged from the hospital.   ° °Please return to the Emergency Department (ED)  immediately if you have ANY thoughts of hurting yourself or anyone else, so that we may help you. ° °Please avoid alcohol and drug use. ° °Follow up with your doctor and/or therapist as soon as possible regarding today's ED  visit.  ° °You may call crisis hotline for [place] at [phone]. ° °

## 2017-09-24 NOTE — ED Notes (Signed)
PT refusing psychiatry consult after he was fully dressed out. Syaff informed pt that his phone would need to be taken for him to be transferred tot he psychiatric area but pt became upset and started reporting that staff had not addressed his complaints and no one had tested his skin. Rn attempted to explain to pt that the fluid coming from his body was normal did not show any signs of infection but pt disagreed and has decided to leave the ED. Pt refusing psychiatric assessment. PT in NAD at this time. Pt ambulatory and able to ambulate to lobby.
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
## 2018-12-02 ENCOUNTER — Emergency Department (HOSPITAL_COMMUNITY): Payer: No Typology Code available for payment source

## 2018-12-02 ENCOUNTER — Observation Stay (HOSPITAL_COMMUNITY)
Admission: EM | Admit: 2018-12-02 | Discharge: 2018-12-03 | Disposition: A | Discharge status: Home / Self Care | Payer: No Typology Code available for payment source | Attending: Internal Medicine | Admitting: Internal Medicine

## 2018-12-02 ENCOUNTER — Encounter (HOSPITAL_COMMUNITY): Payer: Self-pay | Admitting: Emergency Medicine

## 2018-12-02 DIAGNOSIS — S0101XA Laceration without foreign body of scalp, initial encounter: Secondary | ICD-10-CM | POA: Diagnosis present

## 2018-12-02 DIAGNOSIS — S0181XA Laceration without foreign body of other part of head, initial encounter: Secondary | ICD-10-CM | POA: Insufficient documentation

## 2018-12-02 DIAGNOSIS — F17210 Nicotine dependence, cigarettes, uncomplicated: Secondary | ICD-10-CM | POA: Diagnosis not present

## 2018-12-02 DIAGNOSIS — F1510 Other stimulant abuse, uncomplicated: Secondary | ICD-10-CM | POA: Insufficient documentation

## 2018-12-02 DIAGNOSIS — R2681 Unsteadiness on feet: Secondary | ICD-10-CM | POA: Diagnosis not present

## 2018-12-02 DIAGNOSIS — Z716 Tobacco abuse counseling: Secondary | ICD-10-CM | POA: Diagnosis not present

## 2018-12-02 DIAGNOSIS — Z1159 Encounter for screening for other viral diseases: Secondary | ICD-10-CM | POA: Diagnosis not present

## 2018-12-02 DIAGNOSIS — S12100A Unspecified displaced fracture of second cervical vertebra, initial encounter for closed fracture: Secondary | ICD-10-CM | POA: Diagnosis present

## 2018-12-02 DIAGNOSIS — R945 Abnormal results of liver function studies: Secondary | ICD-10-CM | POA: Diagnosis not present

## 2018-12-02 DIAGNOSIS — E876 Hypokalemia: Secondary | ICD-10-CM | POA: Diagnosis not present

## 2018-12-02 DIAGNOSIS — S12101A Unspecified nondisplaced fracture of second cervical vertebra, initial encounter for closed fracture: Secondary | ICD-10-CM | POA: Diagnosis present

## 2018-12-02 DIAGNOSIS — R21 Rash and other nonspecific skin eruption: Secondary | ICD-10-CM | POA: Insufficient documentation

## 2018-12-02 DIAGNOSIS — T07XXXA Unspecified multiple injuries, initial encounter: Secondary | ICD-10-CM

## 2018-12-02 DIAGNOSIS — D649 Anemia, unspecified: Secondary | ICD-10-CM | POA: Diagnosis not present

## 2018-12-02 LAB — CBC
HCT: 38.9 % — ABNORMAL LOW (ref 39.0–52.0)
Hemoglobin: 12.7 g/dL — ABNORMAL LOW (ref 13.0–17.0)
MCH: 28.9 pg (ref 26.0–34.0)
MCHC: 32.6 g/dL (ref 30.0–36.0)
MCV: 88.6 fL (ref 80.0–100.0)
Platelets: 264 10*3/uL (ref 150–400)
RBC: 4.39 MIL/uL (ref 4.22–5.81)
RDW: 12.9 % (ref 11.5–15.5)
WBC: 9.9 10*3/uL (ref 4.0–10.5)
nRBC: 0 % (ref 0.0–0.2)

## 2018-12-02 LAB — I-STAT CHEM 8, ED
BUN: 19 mg/dL (ref 6–20)
Calcium, Ion: 1.07 mmol/L — ABNORMAL LOW (ref 1.15–1.40)
Chloride: 101 mmol/L (ref 98–111)
Creatinine, Ser: 0.8 mg/dL (ref 0.61–1.24)
Glucose, Bld: 112 mg/dL — ABNORMAL HIGH (ref 70–99)
HCT: 38 % — ABNORMAL LOW (ref 39.0–52.0)
Hemoglobin: 12.9 g/dL — ABNORMAL LOW (ref 13.0–17.0)
Potassium: 3.7 mmol/L (ref 3.5–5.1)
Sodium: 134 mmol/L — ABNORMAL LOW (ref 135–145)
TCO2: 25 mmol/L (ref 22–32)

## 2018-12-02 LAB — COMPREHENSIVE METABOLIC PANEL
ALT: 57 U/L — ABNORMAL HIGH (ref 0–44)
AST: 55 U/L — ABNORMAL HIGH (ref 15–41)
Albumin: 3.2 g/dL — ABNORMAL LOW (ref 3.5–5.0)
Alkaline Phosphatase: 85 U/L (ref 38–126)
Anion gap: 11 (ref 5–15)
BUN: 17 mg/dL (ref 6–20)
CO2: 22 mmol/L (ref 22–32)
Calcium: 8.7 mg/dL — ABNORMAL LOW (ref 8.9–10.3)
Chloride: 103 mmol/L (ref 98–111)
Creatinine, Ser: 0.93 mg/dL (ref 0.61–1.24)
GFR calc Af Amer: >60 mL/min (ref >60)
GFR calc non Af Amer: >60 mL/min (ref >60)
Glucose, Bld: 114 mg/dL — ABNORMAL HIGH (ref 70–99)
Potassium: 3.8 mmol/L (ref 3.5–5.1)
Sodium: 136 mmol/L (ref 135–145)
Total Bilirubin: 0.5 mg/dL (ref 0.3–1.2)
Total Protein: 6.7 g/dL (ref 6.5–8.1)

## 2018-12-02 LAB — URINALYSIS, ROUTINE W REFLEX MICROSCOPIC
Bilirubin Urine: NEGATIVE
Glucose, UA: NEGATIVE mg/dL
Hgb urine dipstick: NEGATIVE
Ketones, ur: 5 mg/dL — AB
Leukocytes,Ua: NEGATIVE
Nitrite: NEGATIVE
Protein, ur: NEGATIVE mg/dL
Specific Gravity, Urine: 1.044 — ABNORMAL HIGH (ref 1.005–1.030)
pH: 7 (ref 5.0–8.0)

## 2018-12-02 LAB — CK TOTAL AND CKMB (NOT AT ARMC)
CK, MB: 4.6 ng/mL (ref 0.5–5.0)
Relative Index: 2.3 (ref 0.0–2.5)
Total CK: 201 U/L (ref 49–397)

## 2018-12-02 LAB — ETHANOL: Alcohol, Ethyl (B): <10 mg/dL (ref <10)

## 2018-12-02 LAB — SAMPLE TO BLOOD BANK

## 2018-12-02 LAB — LACTIC ACID, PLASMA: Lactic Acid, Venous: 1.2 mmol/L (ref 0.5–1.9)

## 2018-12-02 LAB — POTASSIUM: Potassium: 4.2 mmol/L (ref 3.5–5.1)

## 2018-12-02 LAB — PROTIME-INR
INR: 1 (ref 0.8–1.2)
Prothrombin Time: 13.3 seconds (ref 11.4–15.2)

## 2018-12-02 LAB — I-STAT CREATININE, ED: Creatinine, Ser: 0.8 mg/dL (ref 0.61–1.24)

## 2018-12-02 LAB — CDS SEROLOGY
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
## 2018-12-02 MED ORDER — POTASSIUM CHLORIDE 10 MEQ/100ML IV SOLN: 10.0000 meq | INTRAVENOUS | Status: DC

## 2018-12-02 MED ORDER — HYDROMORPHONE HCL 1 MG/ML IJ SOLN
INTRAMUSCULAR | Status: AC
  Administered 2018-12-02: 1 mg via INTRAVENOUS
  Filled 2018-12-02: qty 1

## 2018-12-02 MED ORDER — LACTATED RINGERS IV SOLN
INTRAVENOUS | Status: DC
  Administered 2018-12-02: 14:00:00 via INTRAVENOUS

## 2018-12-02 MED ORDER — ACETAMINOPHEN 650 MG RE SUPP: 650.0000 mg | Freq: Four times a day (QID) | RECTAL | Status: DC | PRN

## 2018-12-02 MED ORDER — LIDOCAINE-EPINEPHRINE (PF) 2 %-1:200000 IJ SOLN
10.0000 mL | Freq: Once | INTRAMUSCULAR | Status: AC
  Administered 2018-12-02: 10 mL
  Filled 2018-12-02: qty 20

## 2018-12-02 MED ORDER — HYDROMORPHONE HCL 1 MG/ML IJ SOLN
1.0000 mg | INTRAMUSCULAR | Status: DC | PRN
  Administered 2018-12-02 – 2018-12-03 (×5): 1 mg via INTRAVENOUS
  Filled 2018-12-02 (×5): qty 1

## 2018-12-02 MED ORDER — FENTANYL CITRATE (PF) 100 MCG/2ML IJ SOLN
100.0000 ug | Freq: Once | INTRAMUSCULAR | Status: AC
  Administered 2018-12-02: 100 ug via INTRAVENOUS
  Filled 2018-12-02: qty 2

## 2018-12-02 MED ORDER — ONDANSETRON HCL 4 MG/2ML IJ SOLN
4.0000 mg | Freq: Four times a day (QID) | INTRAMUSCULAR | Status: DC | PRN
  Administered 2018-12-02: 4 mg via INTRAVENOUS
  Filled 2018-12-02: qty 2

## 2018-12-02 MED ORDER — HYDROCODONE-ACETAMINOPHEN 5-325 MG PO TABS: 1.0000 | ORAL_TABLET | ORAL | 0 refills | Status: DC | PRN

## 2018-12-02 MED ORDER — FENTANYL CITRATE (PF) 100 MCG/2ML IJ SOLN
INTRAMUSCULAR | Status: AC
  Administered 2018-12-02: 100 ug via INTRAVENOUS
  Filled 2018-12-02: qty 2

## 2018-12-02 MED ORDER — IOHEXOL 350 MG/ML SOLN
50.0000 mL | Freq: Once | INTRAVENOUS | Status: AC | PRN
  Administered 2018-12-02: 50 mL via INTRAVENOUS

## 2018-12-02 MED ORDER — POTASSIUM CHLORIDE IN NACL 20-0.9 MEQ/L-% IV SOLN
INTRAVENOUS | Status: AC
  Administered 2018-12-02: 20:00:00 via INTRAVENOUS
  Filled 2018-12-02 (×2): qty 1000

## 2018-12-02 MED ORDER — HYDROMORPHONE HCL 1 MG/ML IJ SOLN
1.0000 mg | Freq: Once | INTRAMUSCULAR | Status: AC
  Administered 2018-12-02: 1 mg via INTRAVENOUS

## 2018-12-02 MED ORDER — IOHEXOL 300 MG/ML  SOLN
100.0000 mL | Freq: Once | INTRAMUSCULAR | Status: AC | PRN
  Administered 2018-12-02: 100 mL via INTRAVENOUS

## 2018-12-02 MED ORDER — FENTANYL CITRATE (PF) 100 MCG/2ML IJ SOLN
100.0000 ug | INTRAMUSCULAR | Status: DC | PRN
  Administered 2018-12-02 – 2018-12-03 (×8): 100 ug via INTRAVENOUS
  Filled 2018-12-02 (×7): qty 2

## 2018-12-02 MED ORDER — CYCLOBENZAPRINE HCL 10 MG PO TABS: 10.0000 mg | ORAL_TABLET | Freq: Three times a day (TID) | ORAL | 0 refills | Status: DC | PRN

## 2018-12-02 MED ORDER — ACETAMINOPHEN 325 MG PO TABS
650.0000 mg | ORAL_TABLET | Freq: Four times a day (QID) | ORAL | Status: DC | PRN
  Administered 2018-12-03: 650 mg via ORAL
  Filled 2018-12-02: qty 2

## 2018-12-02 MED ORDER — SODIUM CHLORIDE 0.9 % IV SOLN
INTRAVENOUS | Status: DC
  Administered 2018-12-02: 19:00:00 via INTRAVENOUS

## 2018-12-02 MED ORDER — TRAMADOL HCL 50 MG PO TABS
50.0000 mg | ORAL_TABLET | Freq: Four times a day (QID) | ORAL | Status: DC | PRN
  Administered 2018-12-02 – 2018-12-03 (×3): 50 mg via ORAL
  Filled 2018-12-02 (×3): qty 1

## 2018-12-02 NOTE — ED Notes (Signed)
Pt returned from CT w/this RN
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
## 2018-12-02 NOTE — ED Notes (Signed)
Patient transported to CT w/ this RN. °
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
## 2018-12-02 NOTE — ED Notes (Signed)
Attempted to ambulate patient at this time. Patient c/o of severe pain in neck, nausea, and dizziness. Unable to take more than a couple steps at a time due to discomfort. Notified [place], DO of patients current status. Will continue to monitor.

## 2018-12-03 ENCOUNTER — Encounter (HOSPITAL_COMMUNITY): Payer: Self-pay | Admitting: Emergency Medicine

## 2018-12-03 DIAGNOSIS — S12100A Unspecified displaced fracture of second cervical vertebra, initial encounter for closed fracture: Secondary | ICD-10-CM

## 2018-12-03 LAB — COMPREHENSIVE METABOLIC PANEL
ALT: 52 U/L — ABNORMAL HIGH (ref 0–44)
AST: 50 U/L — ABNORMAL HIGH (ref 15–41)
Albumin: 2.9 g/dL — ABNORMAL LOW (ref 3.5–5.0)
Alkaline Phosphatase: 70 U/L (ref 38–126)
Anion gap: 9 (ref 5–15)
BUN: 9 mg/dL (ref 6–20)
CO2: 23 mmol/L (ref 22–32)
Calcium: 8.5 mg/dL — ABNORMAL LOW (ref 8.9–10.3)
Chloride: 103 mmol/L (ref 98–111)
Creatinine, Ser: 0.83 mg/dL (ref 0.61–1.24)
GFR calc Af Amer: >60 mL/min (ref >60)
GFR calc non Af Amer: >60 mL/min (ref >60)
Glucose, Bld: 110 mg/dL — ABNORMAL HIGH (ref 70–99)
Potassium: 3.4 mmol/L — ABNORMAL LOW (ref 3.5–5.1)
Sodium: 135 mmol/L (ref 135–145)
Total Bilirubin: 0.5 mg/dL (ref 0.3–1.2)
Total Protein: 6.2 g/dL — ABNORMAL LOW (ref 6.5–8.1)

## 2018-12-03 LAB — CBC
HCT: 36 % — ABNORMAL LOW (ref 39.0–52.0)
Hemoglobin: 11.9 g/dL — ABNORMAL LOW (ref 13.0–17.0)
MCH: 28.7 pg (ref 26.0–34.0)
MCHC: 33.1 g/dL (ref 30.0–36.0)
MCV: 86.7 fL (ref 80.0–100.0)
Platelets: 251 10*3/uL (ref 150–400)
RBC: 4.15 MIL/uL — ABNORMAL LOW (ref 4.22–5.81)
RDW: 13.2 % (ref 11.5–15.5)
WBC: 10.1 10*3/uL (ref 4.0–10.5)
nRBC: 0 % (ref 0.0–0.2)

## 2018-12-03 LAB — HIV ANTIBODY (ROUTINE TESTING W REFLEX): HIV Screen 4th Generation wRfx: NONREACTIVE

## 2018-12-03 LAB — NOVEL CORONAVIRUS, NAA (HOSP ORDER, SEND-OUT TO REF LAB; TAT 18-24 HRS): SARS-CoV-2, NAA: NOT DETECTED

## 2018-12-03 MED ORDER — BACITRACIN ZINC 500 UNIT/GM EX OINT
TOPICAL_OINTMENT | Freq: Two times a day (BID) | CUTANEOUS | Status: DC
  Filled 2018-12-03: qty 28.4

## 2018-12-03 MED ORDER — POTASSIUM CHLORIDE CRYS ER 20 MEQ PO TBCR
40.0000 meq | EXTENDED_RELEASE_TABLET | Freq: Once | ORAL | Status: AC
  Administered 2018-12-03: 40 meq via ORAL
  Filled 2018-12-03: qty 2

## 2018-12-03 MED ORDER — BACITRACIN ZINC 500 UNIT/GM EX OINT: TOPICAL_OINTMENT | Freq: Two times a day (BID) | CUTANEOUS | 0 refills | Status: AC
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
## 2018-12-03 NOTE — Plan of Care (Signed)
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
## 2018-12-03 NOTE — Progress Notes (Signed)
Overall patient doing reasonably well.  Patient complains of neck pain.  No radicular symptoms.  No signs of cranial neuropathy or neurocognitive problems.  Motor and sensory examination intact.  Collar fits well.  Patient with C2 hangman's fracture.  Continue immobilization in collar.  Patient may be mobilized in collar without restriction from my standpoint.  Should follow-up in my office in 2 weeks.

## 2018-12-11 ENCOUNTER — Encounter: Payer: Self-pay | Admitting: Emergency Medicine

## 2018-12-11 ENCOUNTER — Emergency Department: Payer: Self-pay

## 2018-12-11 ENCOUNTER — Emergency Department
Admission: EM | Admit: 2018-12-11 | Discharge: 2018-12-12 | Disposition: A | Discharge status: Home / Self Care | Payer: Self-pay | Attending: Emergency Medicine | Admitting: Emergency Medicine

## 2018-12-11 ENCOUNTER — Other Ambulatory Visit: Payer: Self-pay

## 2018-12-11 DIAGNOSIS — F17210 Nicotine dependence, cigarettes, uncomplicated: Secondary | ICD-10-CM | POA: Insufficient documentation

## 2018-12-11 DIAGNOSIS — M542 Cervicalgia: Secondary | ICD-10-CM | POA: Insufficient documentation

## 2018-12-11 DIAGNOSIS — M5432 Sciatica, left side: Secondary | ICD-10-CM | POA: Insufficient documentation
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
## 2018-12-11 MED ORDER — OXYCODONE-ACETAMINOPHEN 5-325 MG PO TABS
1.0000 | ORAL_TABLET | Freq: Once | ORAL | Status: AC
  Administered 2018-12-11: 23:00:00 1 via ORAL
  Filled 2018-12-11: qty 1

## 2018-12-11 MED ORDER — OXYCODONE-ACETAMINOPHEN 5-325 MG PO TABS
1.0000 | ORAL_TABLET | Freq: Once | ORAL | Status: AC
  Administered 2018-12-11: 22:00:00 1 via ORAL
  Filled 2018-12-11: qty 1
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
## 2018-12-11 NOTE — ED Notes (Signed)
Meal tray and cup of coke given to pt by this tech. RN notified
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
## 2018-12-11 NOTE — ED Notes (Signed)
Patient transported to MRI by MRI tech. 

## 2018-12-12 MED ORDER — OXYCODONE-ACETAMINOPHEN 5-325 MG PO TABS: 1.0000 | ORAL_TABLET | ORAL | 0 refills | Status: DC | PRN
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
## 2018-12-19 ENCOUNTER — Emergency Department
Admission: EM | Admit: 2018-12-19 | Discharge: 2018-12-20 | Disposition: A | Discharge status: Home / Self Care | Payer: Self-pay | Attending: Emergency Medicine | Admitting: Emergency Medicine

## 2018-12-19 ENCOUNTER — Other Ambulatory Visit: Payer: Self-pay

## 2018-12-19 ENCOUNTER — Encounter: Payer: Self-pay | Admitting: Emergency Medicine

## 2018-12-19 DIAGNOSIS — M5416 Radiculopathy, lumbar region: Secondary | ICD-10-CM | POA: Insufficient documentation

## 2018-12-19 DIAGNOSIS — F17210 Nicotine dependence, cigarettes, uncomplicated: Secondary | ICD-10-CM | POA: Insufficient documentation

## 2018-12-19 DIAGNOSIS — Z79899 Other long term (current) drug therapy: Secondary | ICD-10-CM | POA: Insufficient documentation
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
## 2018-12-20 ENCOUNTER — Emergency Department: Payer: Self-pay

## 2018-12-20 LAB — COMPREHENSIVE METABOLIC PANEL
ALT: 29 U/L (ref 0–44)
AST: 31 U/L (ref 15–41)
Albumin: 3.2 g/dL — ABNORMAL LOW (ref 3.5–5.0)
Alkaline Phosphatase: 91 U/L (ref 38–126)
Anion gap: 10 (ref 5–15)
BUN: 8 mg/dL (ref 6–20)
CO2: 25 mmol/L (ref 22–32)
Calcium: 8.2 mg/dL — ABNORMAL LOW (ref 8.9–10.3)
Chloride: 103 mmol/L (ref 98–111)
Creatinine, Ser: 0.64 mg/dL (ref 0.61–1.24)
GFR calc Af Amer: >60 mL/min (ref >60)
GFR calc non Af Amer: >60 mL/min (ref >60)
Glucose, Bld: 107 mg/dL — ABNORMAL HIGH (ref 70–99)
Potassium: 3.4 mmol/L — ABNORMAL LOW (ref 3.5–5.1)
Sodium: 138 mmol/L (ref 135–145)
Total Bilirubin: 0.3 mg/dL (ref 0.3–1.2)
Total Protein: 6.6 g/dL (ref 6.5–8.1)

## 2018-12-20 LAB — CBC
HCT: 36.5 % — ABNORMAL LOW (ref 39.0–52.0)
Hemoglobin: 11.9 g/dL — ABNORMAL LOW (ref 13.0–17.0)
MCH: 28.5 pg (ref 26.0–34.0)
MCHC: 32.6 g/dL (ref 30.0–36.0)
MCV: 87.3 fL (ref 80.0–100.0)
Platelets: 301 10*3/uL (ref 150–400)
RBC: 4.18 MIL/uL — ABNORMAL LOW (ref 4.22–5.81)
RDW: 13.6 % (ref 11.5–15.5)
WBC: 8.3 10*3/uL (ref 4.0–10.5)
nRBC: 0 % (ref 0.0–0.2)
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
## 2018-12-20 MED ORDER — MORPHINE SULFATE (PF) 4 MG/ML IV SOLN
INTRAVENOUS | Status: AC
  Administered 2018-12-20: 03:00:00 4 mg via INTRAVENOUS
  Filled 2018-12-20: qty 1

## 2018-12-20 MED ORDER — MORPHINE SULFATE (PF) 4 MG/ML IV SOLN
4.0000 mg | Freq: Once | INTRAVENOUS | Status: AC
  Administered 2018-12-20: 03:00:00 4 mg via INTRAVENOUS
  Filled 2018-12-20: qty 1

## 2018-12-20 MED ORDER — ONDANSETRON HCL 4 MG/2ML IJ SOLN
4.0000 mg | Freq: Once | INTRAMUSCULAR | Status: AC
  Administered 2018-12-20: 02:00:00 4 mg via INTRAVENOUS

## 2018-12-20 MED ORDER — LIDOCAINE 5 % EX PTCH: 1.0000 | MEDICATED_PATCH | Freq: Two times a day (BID) | CUTANEOUS | 0 refills | Status: AC

## 2018-12-20 MED ORDER — ONDANSETRON HCL 4 MG/2ML IJ SOLN
INTRAMUSCULAR | Status: AC
  Administered 2018-12-20: 02:00:00 4 mg via INTRAVENOUS
  Filled 2018-12-20: qty 2

## 2018-12-20 MED ORDER — CYCLOBENZAPRINE HCL 10 MG PO TABS: 10.0000 mg | ORAL_TABLET | Freq: Three times a day (TID) | ORAL | 0 refills | Status: DC | PRN

## 2018-12-20 MED ORDER — HYDROMORPHONE HCL 1 MG/ML IJ SOLN
1.0000 mg | Freq: Once | INTRAMUSCULAR | Status: AC
  Administered 2018-12-20: 04:00:00 1 mg via INTRAVENOUS
  Filled 2018-12-20: qty 1

## 2018-12-20 NOTE — ED Notes (Signed)
Patient ambulated around room without assistance.
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
## 2019-01-19 ENCOUNTER — Emergency Department
Admission: EM | Admit: 2019-01-19 | Discharge: 2019-01-19 | Attending: Emergency Medicine | Admitting: Emergency Medicine

## 2019-01-19 ENCOUNTER — Emergency Department

## 2019-01-19 ENCOUNTER — Other Ambulatory Visit: Payer: Self-pay

## 2019-01-19 DIAGNOSIS — W19XXXA Unspecified fall, initial encounter: Secondary | ICD-10-CM

## 2019-01-19 DIAGNOSIS — F17210 Nicotine dependence, cigarettes, uncomplicated: Secondary | ICD-10-CM | POA: Insufficient documentation

## 2019-01-19 DIAGNOSIS — W182XXA Fall in (into) shower or empty bathtub, initial encounter: Secondary | ICD-10-CM | POA: Insufficient documentation

## 2019-01-19 DIAGNOSIS — M5440 Lumbago with sciatica, unspecified side: Secondary | ICD-10-CM | POA: Insufficient documentation

## 2019-01-19 DIAGNOSIS — M545 Low back pain: Secondary | ICD-10-CM | POA: Diagnosis present

## 2019-01-19 DIAGNOSIS — R200 Anesthesia of skin: Secondary | ICD-10-CM | POA: Diagnosis not present

## 2019-01-19 DIAGNOSIS — M542 Cervicalgia: Secondary | ICD-10-CM | POA: Insufficient documentation

## 2019-01-19 LAB — CBC WITH DIFFERENTIAL/PLATELET
Abs Immature Granulocytes: 0.03 10*3/uL (ref 0.00–0.07)
Basophils Absolute: 0.1 10*3/uL (ref 0.0–0.1)
Basophils Relative: 1 %
Eosinophils Absolute: 0.1 10*3/uL (ref 0.0–0.5)
Eosinophils Relative: 1 %
HCT: 47.3 % (ref 39.0–52.0)
Hemoglobin: 15.8 g/dL (ref 13.0–17.0)
Immature Granulocytes: 0 %
Lymphocytes Relative: 36 %
Lymphs Abs: 2.8 10*3/uL (ref 0.7–4.0)
MCH: 28.7 pg (ref 26.0–34.0)
MCHC: 33.4 g/dL (ref 30.0–36.0)
MCV: 86 fL (ref 80.0–100.0)
Monocytes Absolute: 0.5 10*3/uL (ref 0.1–1.0)
Monocytes Relative: 6 %
Neutro Abs: 4.3 10*3/uL (ref 1.7–7.7)
Neutrophils Relative %: 56 %
Platelets: 378 10*3/uL (ref 150–400)
RBC: 5.5 MIL/uL (ref 4.22–5.81)
RDW: 13.3 % (ref 11.5–15.5)
WBC: 7.7 10*3/uL (ref 4.0–10.5)
nRBC: 0 % (ref 0.0–0.2)

## 2019-01-19 LAB — COMPREHENSIVE METABOLIC PANEL
ALT: 43 U/L (ref 0–44)
AST: 32 U/L (ref 15–41)
Albumin: 4.4 g/dL (ref 3.5–5.0)
Alkaline Phosphatase: 83 U/L (ref 38–126)
Anion gap: 12 (ref 5–15)
BUN: 11 mg/dL (ref 6–20)
CO2: 26 mmol/L (ref 22–32)
Calcium: 9.4 mg/dL (ref 8.9–10.3)
Chloride: 98 mmol/L (ref 98–111)
Creatinine, Ser: 0.89 mg/dL (ref 0.61–1.24)
GFR calc Af Amer: >60 mL/min (ref >60)
GFR calc non Af Amer: >60 mL/min (ref >60)
Glucose, Bld: 74 mg/dL (ref 70–99)
Potassium: 4.2 mmol/L (ref 3.5–5.1)
Sodium: 136 mmol/L (ref 135–145)
Total Bilirubin: 0.6 mg/dL (ref 0.3–1.2)
Total Protein: 8.2 g/dL — ABNORMAL HIGH (ref 6.5–8.1)

## 2019-01-19 LAB — ETHANOL: Alcohol, Ethyl (B): <10 mg/dL (ref <10)

## 2019-01-19 IMAGING — CT CT HEAD WITHOUT CONTRAST
5 of 7 series · 17 of 47 positions shown, 18 images · non-contrast
Comparison: Head and cervical spine dated [DATE].

CLINICAL DATA: Left leg numbness following a fall.

EXAM:
CT HEAD WITHOUT CONTRAST
CT CERVICAL SPINE WITHOUT CONTRAST
TECHNIQUE: Multidetector CT imaging of the head and cervical spine was
performed following the standard protocol without intravenous
contrast. Multiplanar CT image reconstructions of the cervical spine
were also generated.

[Series 2: head wo · axial · 0.43mm/px · z∈[-54,+21]mm · 3 of 31 slices shown, 4 images]
[im 8/31  brain]
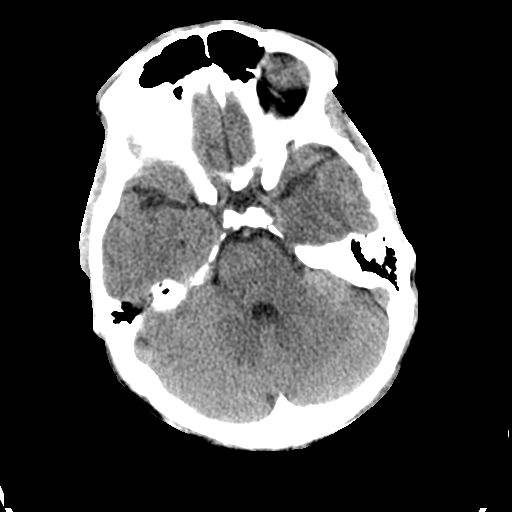
[im 8/31  bone]
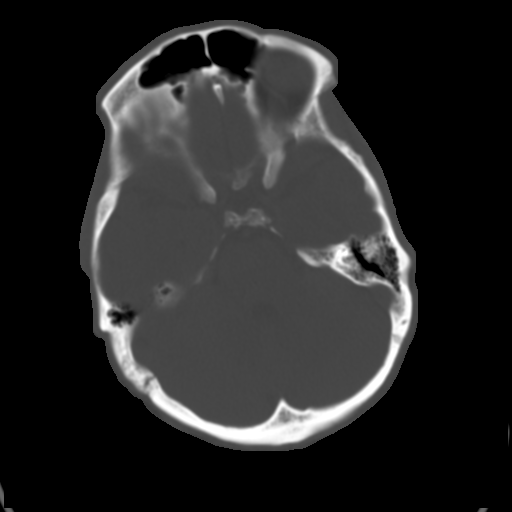
[im 16/31  brain]
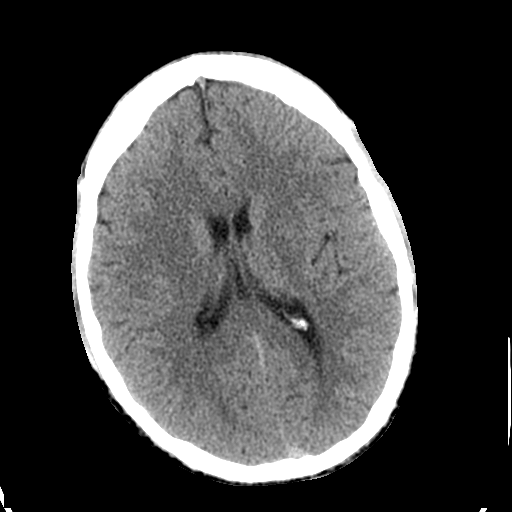
[im 23/31  brain]
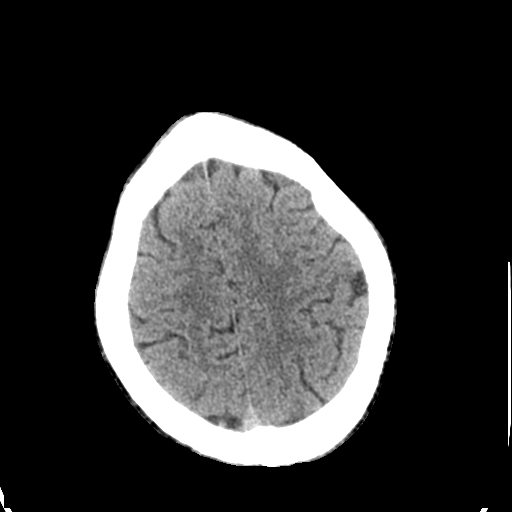

[Series 4: coronal soft tissue · coronal · 0.31mm/px · 2 of 71 slices shown]
[im 15/71  brain]
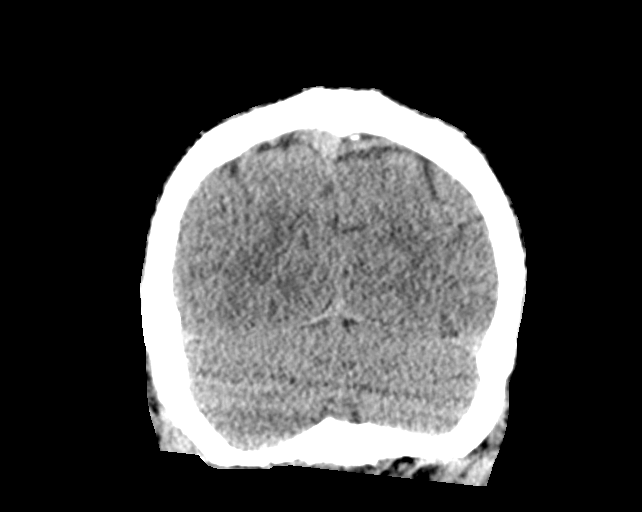
[im 29/71  brain]
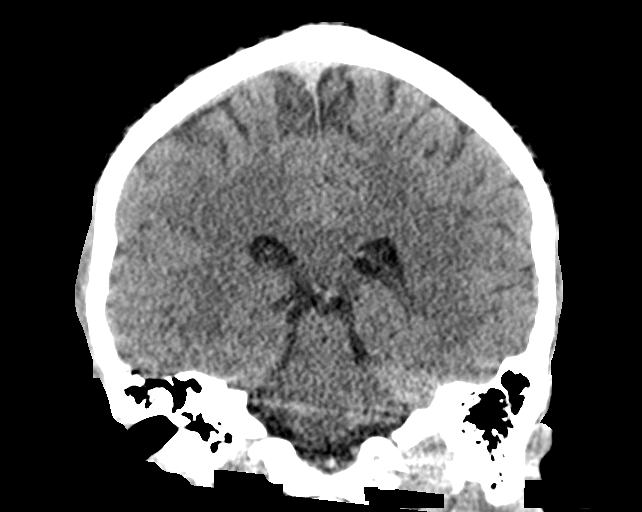

[Series 5: sagittal soft tissue · sagittal · 0.30mm/px · 1 of 60 slices shown]
[im 30/60  brain]
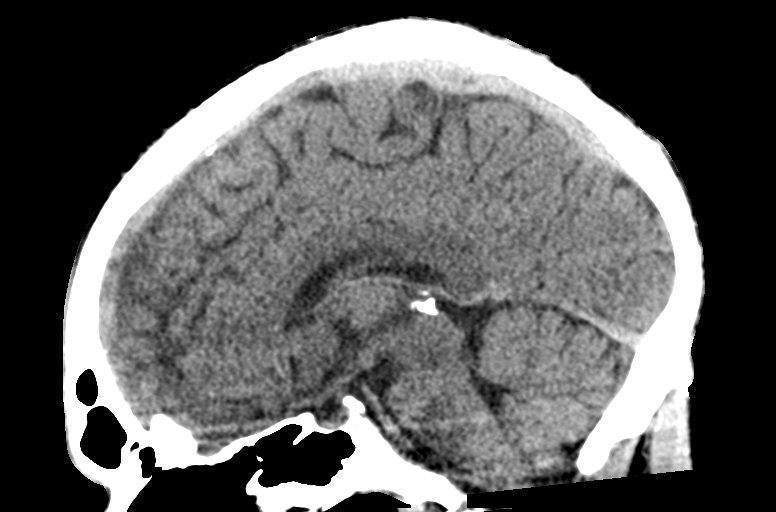

[Series 7: c spine soft · axial · 0.32mm/px · z∈[-251,-203]mm · 3 of 88 slices shown]
[im 8/88  brain]
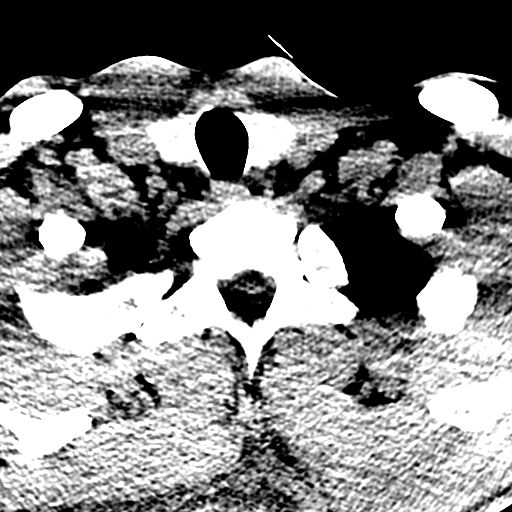
[im 16/88  brain]
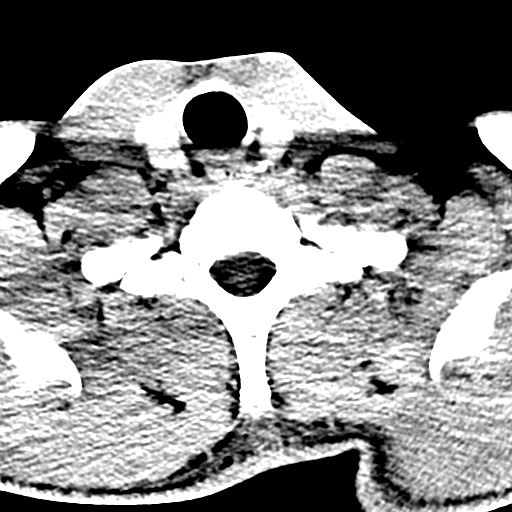
[im 32/88  brain]
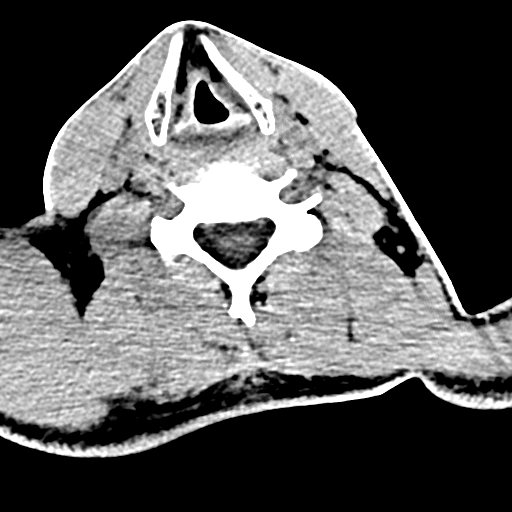

[Series 12: orthogonal bone · axial · 0.23mm/px · z∈[-266,-108]mm · 8 of 96 slices shown]
[im 8/96  bone]
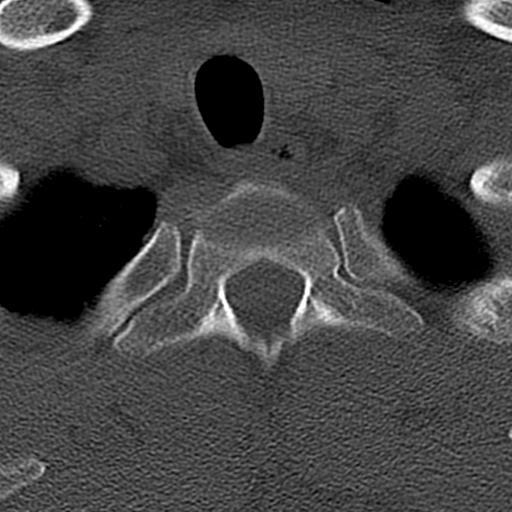
[im 24/96  bone]
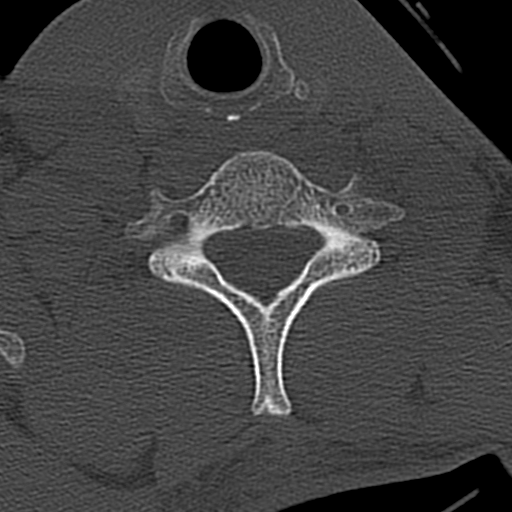
[im 32/96  bone]
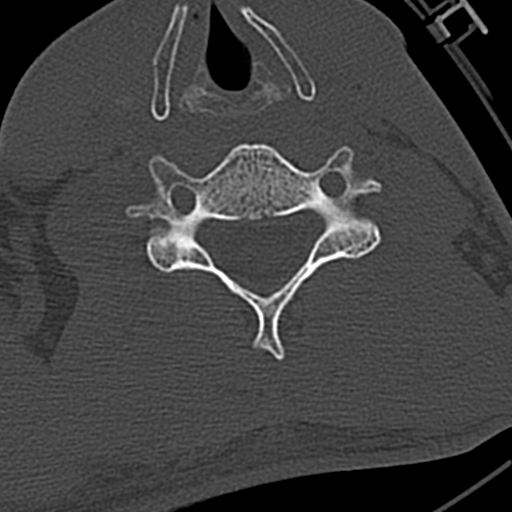
[im 40/96  bone]
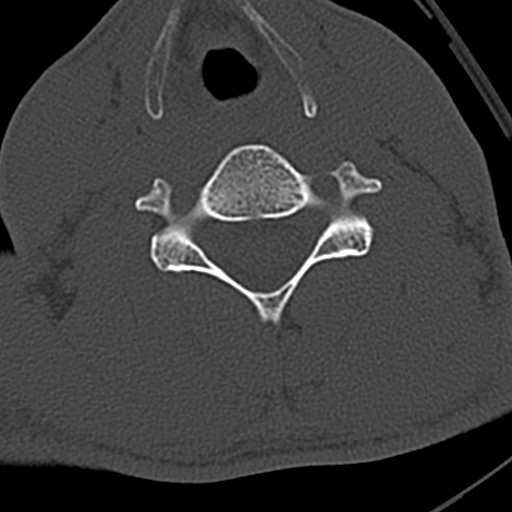
[im 56/96  bone]
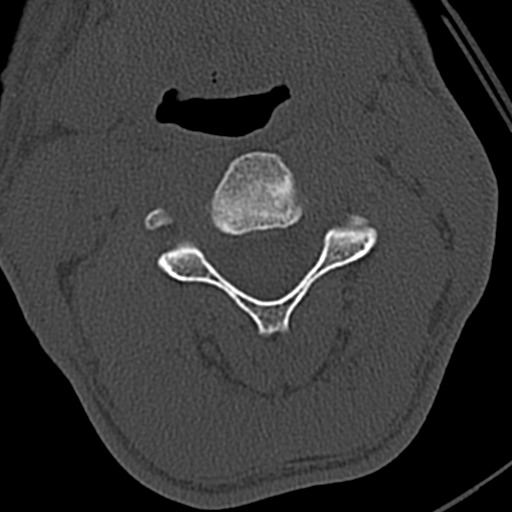
[im 64/96  bone]
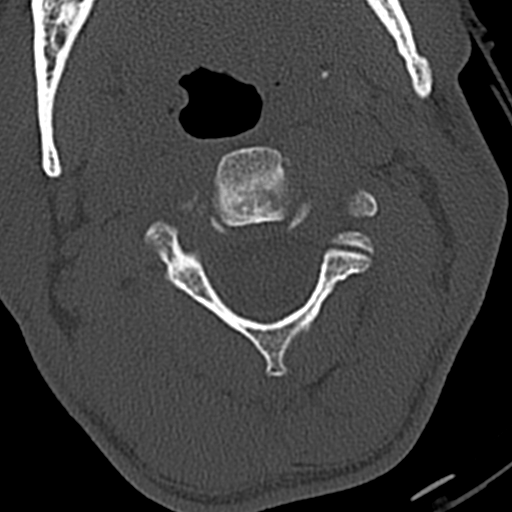
[im 72/96  bone]
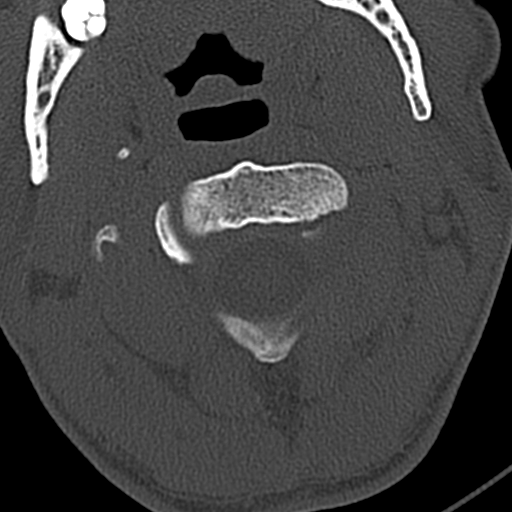
[im 88/96  bone]
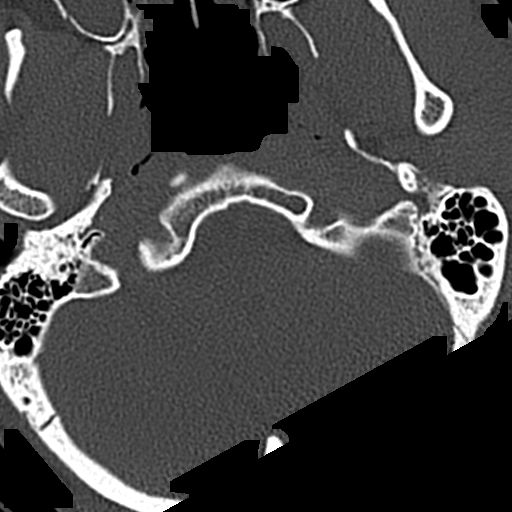

[17 of 47 positions shown; findings below may reference images not displayed]

FINDINGS: CT HEAD FINDINGS

Brain: Normal appearing cerebral hemispheres and posterior fossa
structures. Normal size and position of the ventricles. No
intracranial hemorrhage, mass lesion or CT evidence of acute
infarction.

Vascular: No hyperdense vessel or unexpected calcification.

Skull: Normal. Negative for fracture or focal lesion.

Sinuses/Orbits: Interval moderate right maxillary sinus mucosal
thickening. Unremarkable orbits.

Other: None.

CT CERVICAL SPINE FINDINGS

Alignment: 4 mm of anterolisthesis at the C2-3 level, 3 mm in
corresponding measurement on [DATE]. Normal alignment in the
remainder of the cervical and upper thoracic spine.

Skull base and vertebrae: Previously described bilateral C2
fractures are again demonstrated. There has been some interval
callus formation anteriorly on the right. The majority of the
fracture on the right demonstrates nonunion. The previously
demonstrated comminuted fracture on the left is unchanged, with
nonunion. No new fractures are seen.

Soft tissues and spinal canal: No prevertebral fluid or swelling. No
visible canal hematoma.

Disc levels:  Unremarkable.

Upper chest: Clear lung apices.

Other: None.
IMPRESSION: 1. Minimal increase in anterolisthesis at the C2-3 level, currently
measuring 4 mm.
2. Previously demonstrated bilateral C2 fractures with nonunion.
There is mild interval callus formation anteriorly on the right.
3. No skull fracture or intracranial hemorrhage.
4. Interval moderate chronic right maxillary sinusitis.

## 2019-01-19 IMAGING — CT CT LUMBAR SPINE WITHOUT CONTRAST
3 series · 12 of 33 positions shown, 14 images · non-contrast
Comparison: MRI lumbar spine dated [DATE]

CLINICAL DATA: A spine fracture.

EXAM:
CT LUMBAR SPINE WITHOUT CONTRAST
TECHNIQUE: Multidetector CT imaging of the lumbar spine was performed without
intravenous contrast administration. Multiplanar CT image
reconstructions were also generated.

[Series 4: l spine soft · axial · 0.34mm/px · z∈[-711,-515]mm · 4 of 142 slices shown, 5 images]
[im 22/142  soft-tissue]
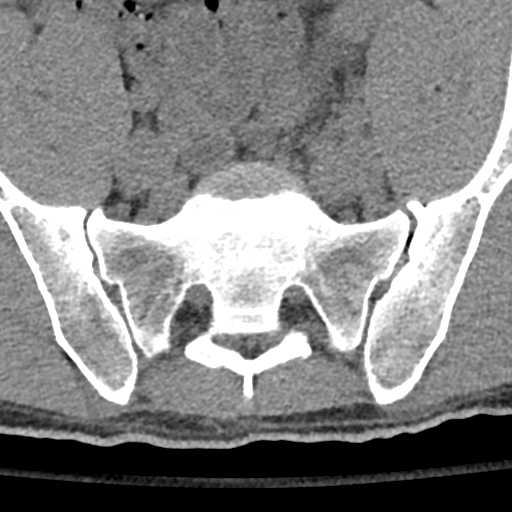
[im 22/142  bone]
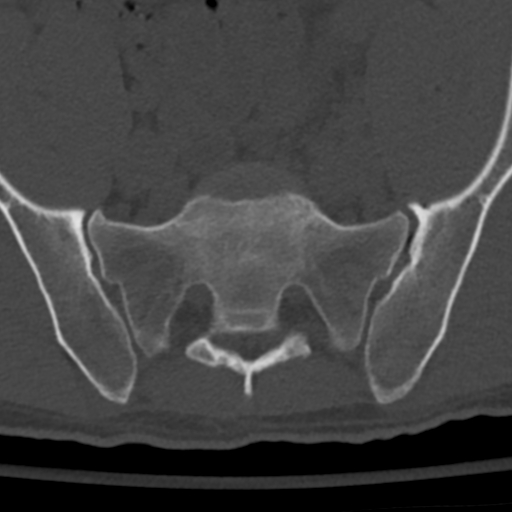
[im 55/142  bone]
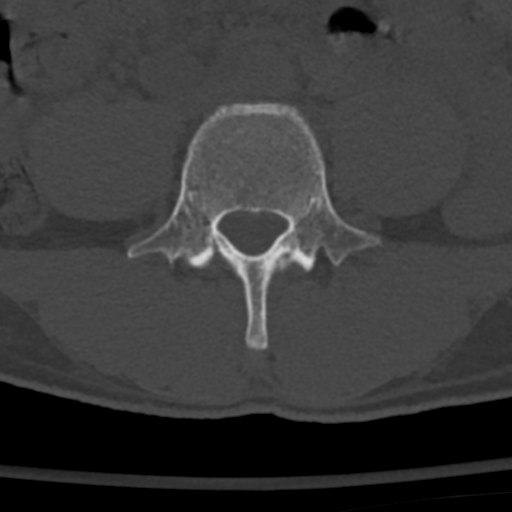
[im 87/142  bone]
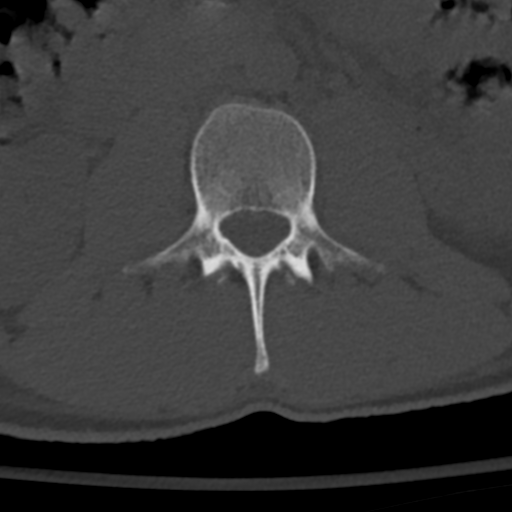
[im 120/142  bone]
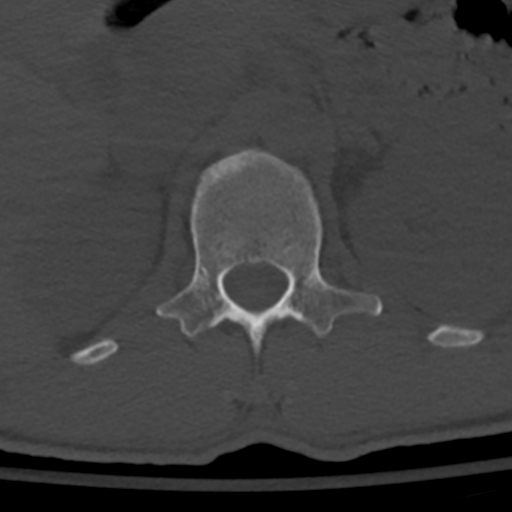

[Series 7: sagittal bone · sagittal · 0.32mm/px · 5 of 83 slices shown, 6 images]
[im 28/83  bone]
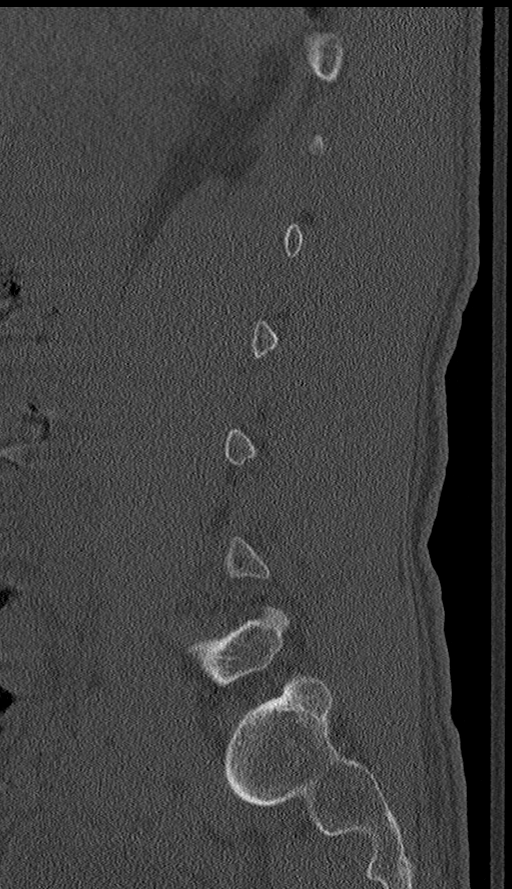
[im 35/83  bone]
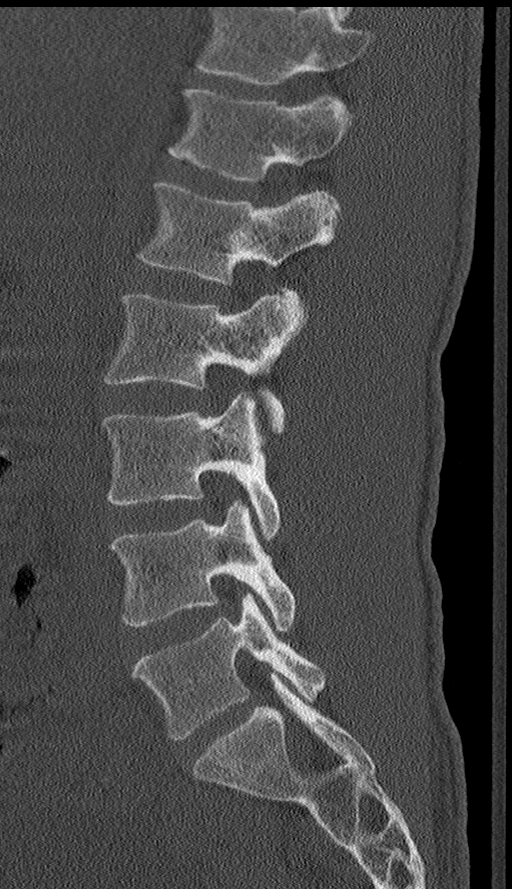
[im 42/83  soft-tissue]
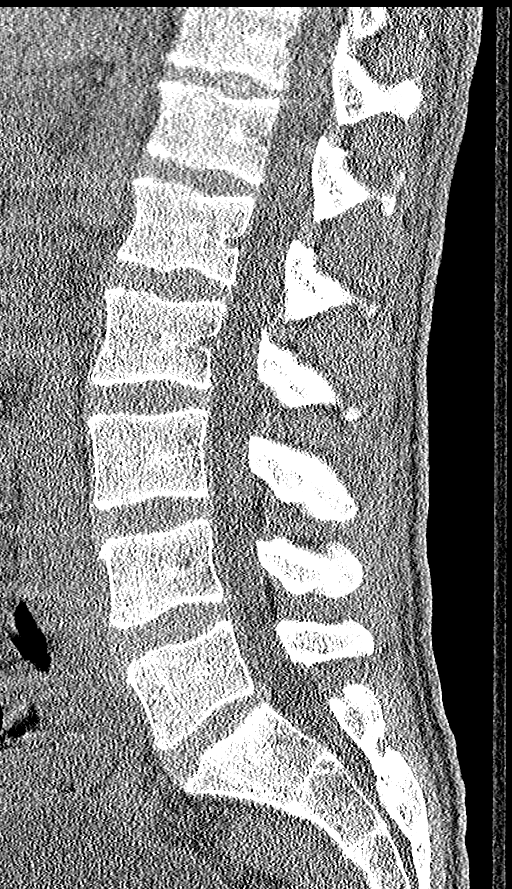
[im 42/83  bone]
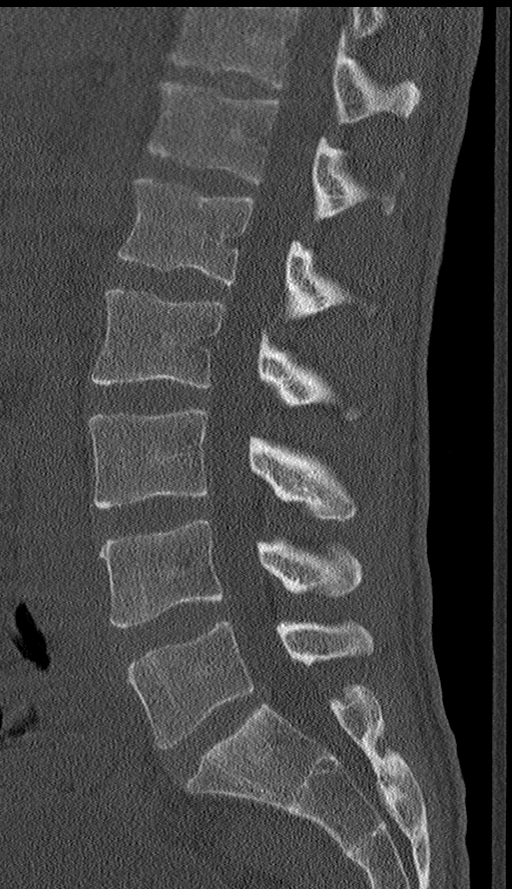
[im 48/83  bone]
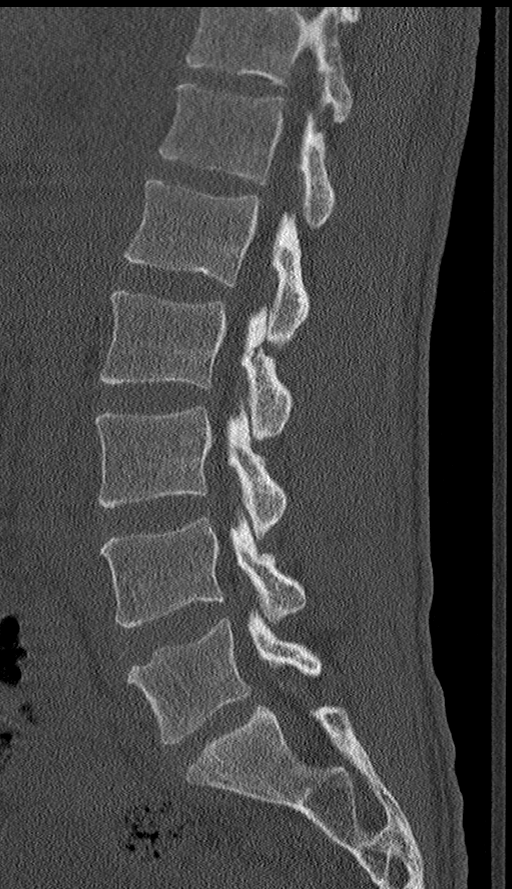
[im 55/83  bone]
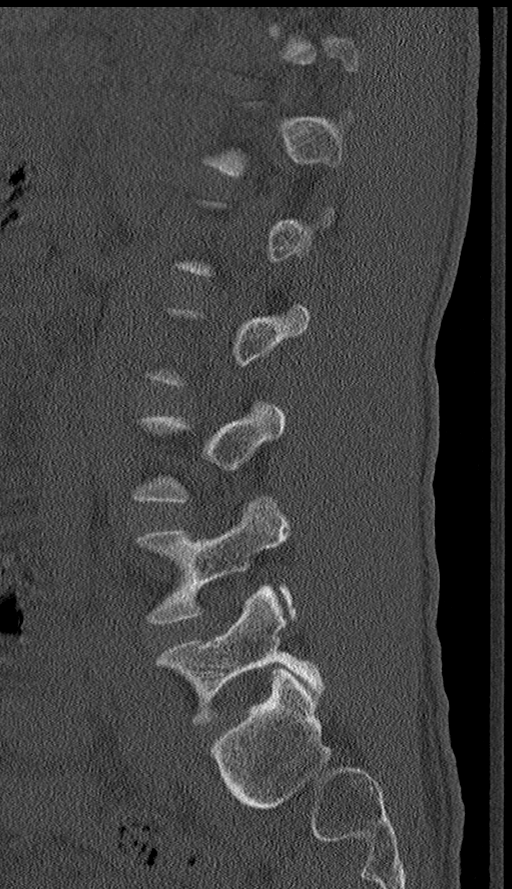

[Series 8: coronal bone · coronal · 0.34mm/px · 3 of 81 slices shown]
[im 17/81  bone]
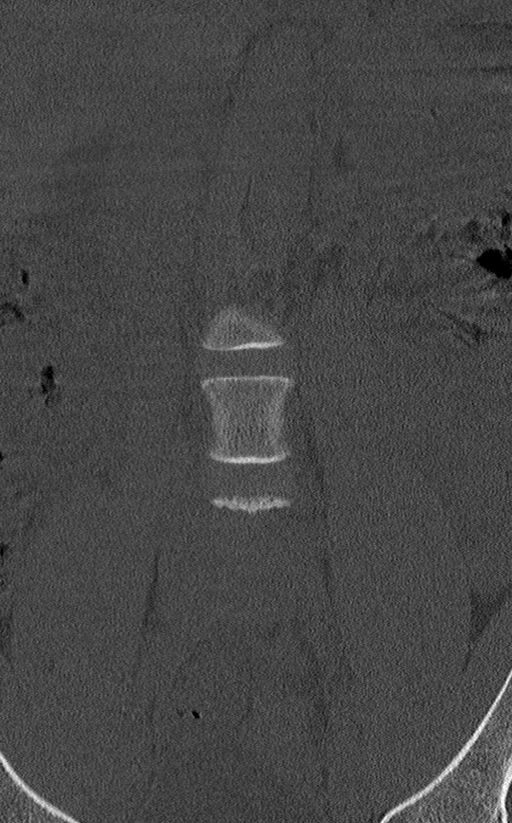
[im 33/81  bone]
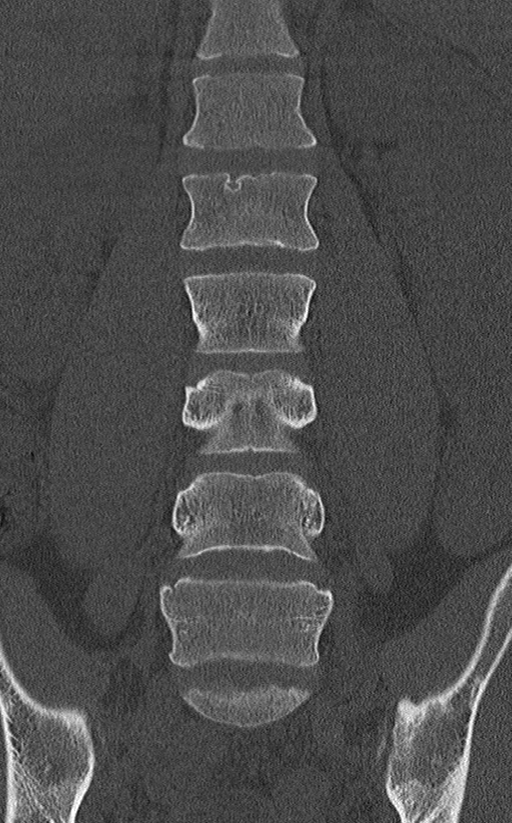
[im 49/81  bone]
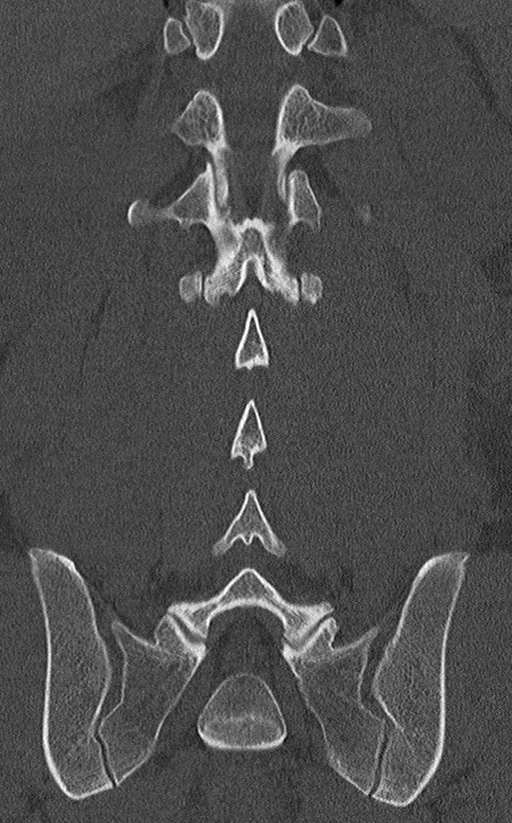

[12 of 33 positions shown; findings below may reference images not displayed]

FINDINGS: Segmentation: There are 6 non rib-bearing lumbar type vertebral
bodies.

Alignment: Normal.

Vertebrae: No acute fracture or focal pathologic process.

Paraspinal and other soft tissues: Negative.

Disc levels: There is mild multilevel disc height loss, greatest at
the lower lumbar segments.
IMPRESSION: No acute displaced fracture.

## 2019-01-19 MED ORDER — DIFLUNISAL 500 MG PO TABS: 500.0000 mg | ORAL_TABLET | Freq: Once | ORAL | Status: DC

## 2019-01-19 MED ORDER — ACETAMINOPHEN 325 MG PO TABS
650.0000 mg | ORAL_TABLET | Freq: Once | ORAL | Status: AC
  Administered 2019-01-19: 17:00:00 650 mg via ORAL

## 2019-01-19 MED ORDER — DEXAMETHASONE SODIUM PHOSPHATE 10 MG/ML IJ SOLN
10.0000 mg | Freq: Once | INTRAMUSCULAR | Status: AC
  Administered 2019-01-19: 16:00:00 10 mg via INTRAVENOUS
  Filled 2019-01-19: qty 1

## 2019-01-19 MED ORDER — ACETAMINOPHEN 325 MG PO TABS
ORAL_TABLET | ORAL | Status: AC
  Filled 2019-01-19: qty 2

## 2019-01-19 MED ORDER — KETOROLAC TROMETHAMINE 30 MG/ML IJ SOLN
30.0000 mg | Freq: Once | INTRAMUSCULAR | Status: AC
  Administered 2019-01-19: 30 mg via INTRAVENOUS
  Filled 2019-01-19: qty 1

## 2019-01-19 MED ORDER — IBUPROFEN 600 MG PO TABS: 600.0000 mg | ORAL_TABLET | Freq: Three times a day (TID) | ORAL | 0 refills | Status: DC | PRN

## 2019-01-19 NOTE — ED Notes (Signed)
EDP at bedside  
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
## 2019-01-19 NOTE — ED Notes (Signed)
Pt taken to xray, officer escorted from jail remains with pt at this time.

## 2019-01-19 NOTE — ED Notes (Signed)
Pt returned from CT via stretcher.
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
## 2019-01-25 ENCOUNTER — Encounter: Payer: Self-pay | Admitting: Emergency Medicine

## 2019-01-25 ENCOUNTER — Other Ambulatory Visit: Payer: Self-pay

## 2019-01-25 ENCOUNTER — Emergency Department
Admission: EM | Admit: 2019-01-25 | Discharge: 2019-01-25 | Attending: Emergency Medicine | Admitting: Emergency Medicine

## 2019-01-25 ENCOUNTER — Emergency Department

## 2019-01-25 DIAGNOSIS — F17210 Nicotine dependence, cigarettes, uncomplicated: Secondary | ICD-10-CM | POA: Diagnosis not present

## 2019-01-25 DIAGNOSIS — S12101K Unspecified nondisplaced fracture of second cervical vertebra, subsequent encounter for fracture with nonunion: Secondary | ICD-10-CM | POA: Diagnosis not present

## 2019-01-25 DIAGNOSIS — M5430 Sciatica, unspecified side: Secondary | ICD-10-CM | POA: Diagnosis not present

## 2019-01-25 DIAGNOSIS — M545 Low back pain: Secondary | ICD-10-CM | POA: Diagnosis present

## 2019-01-25 IMAGING — CT CT CERVICAL SPINE WITHOUT CONTRAST
3 of 4 series · 11 of 33 positions shown, 13 images · non-contrast
Comparison: Recent CT from [DATE].

CLINICAL DATA: Initial evaluation for chronic neck and back pain,
known C2 fracture.

EXAM:
CT CERVICAL SPINE WITHOUT CONTRAST
TECHNIQUE: Multidetector CT imaging of the cervical spine was performed without
intravenous contrast. Multiplanar CT image reconstructions were also
generated.

[Series 6: sagittal bone · sagittal · 0.26mm/px · 5 of 56 slices shown, 6 images]
[im 19/56  bone]
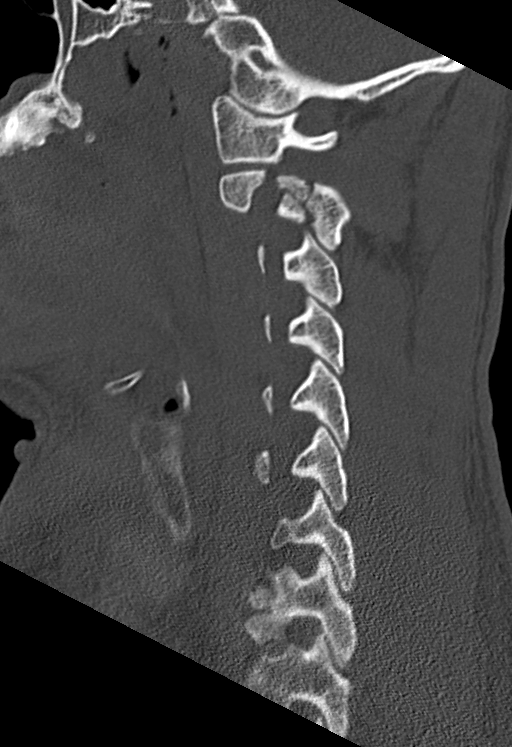
[im 23/56  bone]
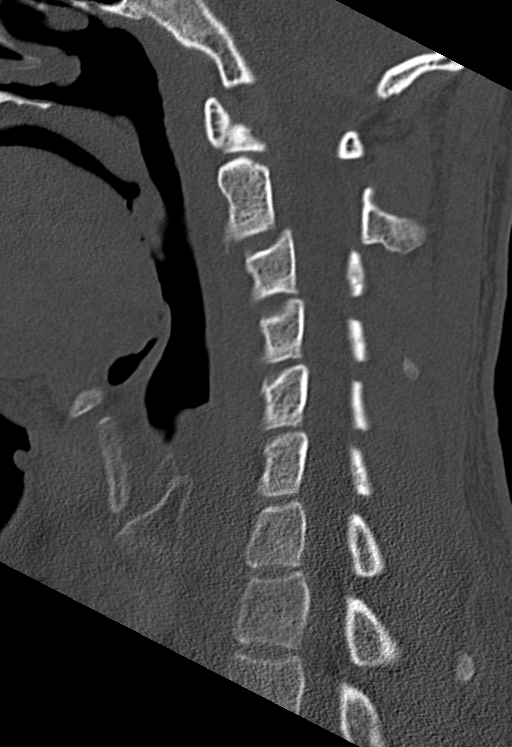
[im 28/56  soft-tissue]
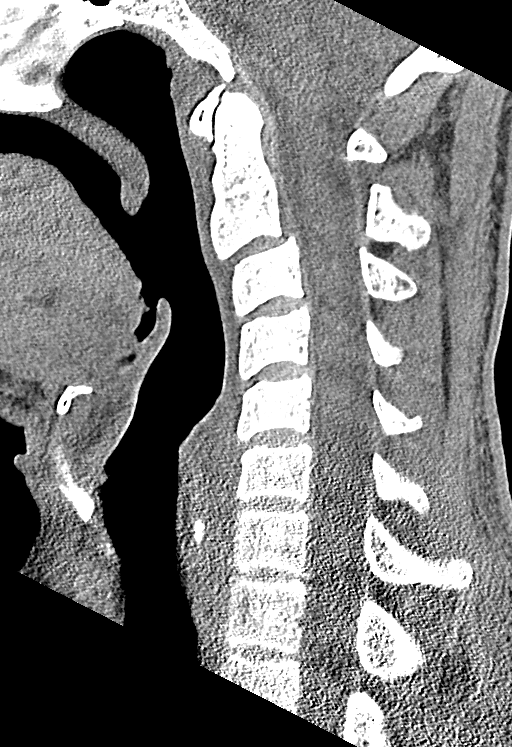
[im 28/56  bone]
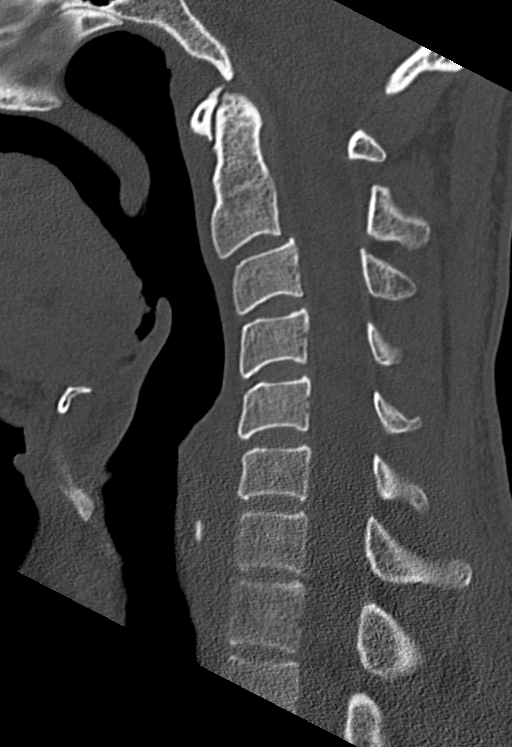
[im 33/56  bone]
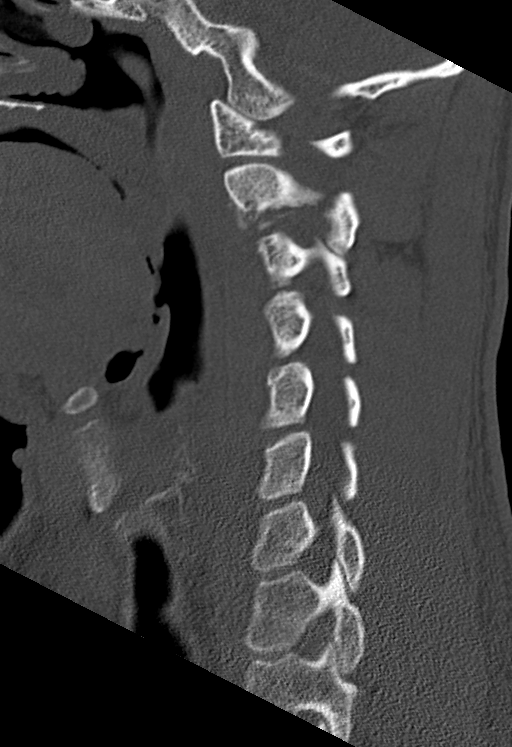
[im 37/56  bone]
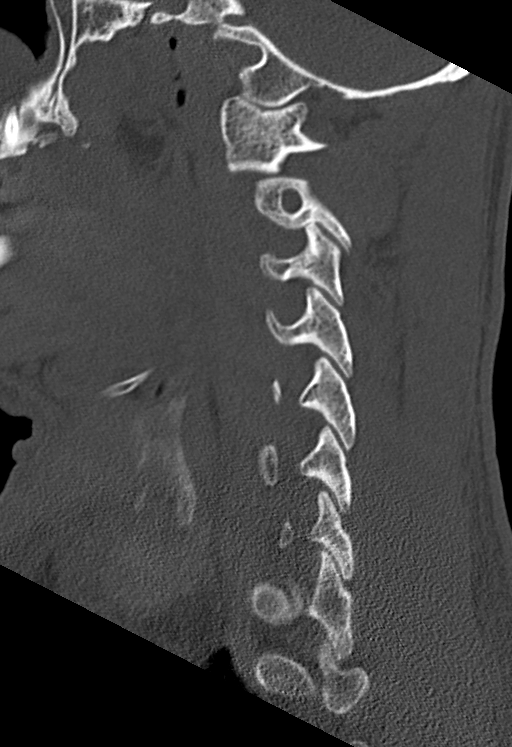

[Series 7: coronal bone · coronal · 0.23mm/px · 3 of 51 slices shown]
[im 11/51  bone]
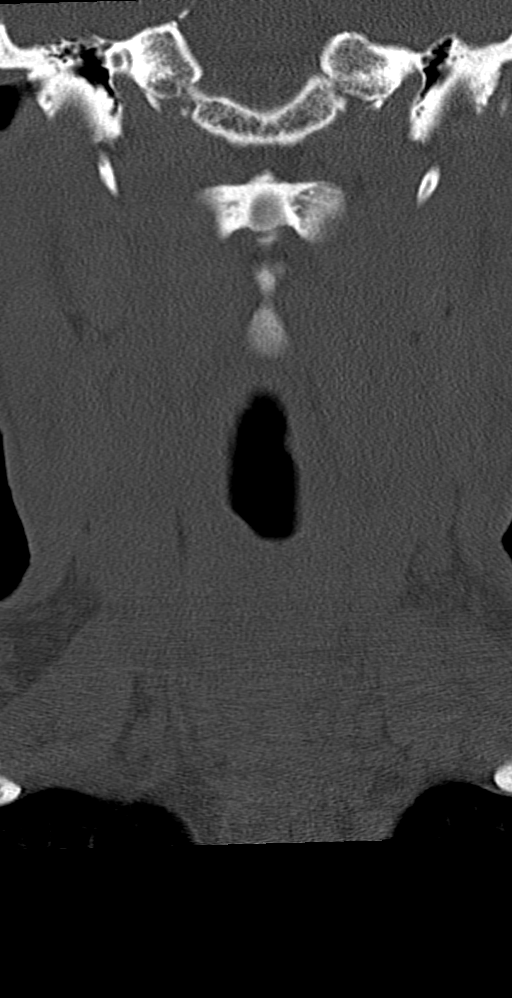
[im 21/51  bone]
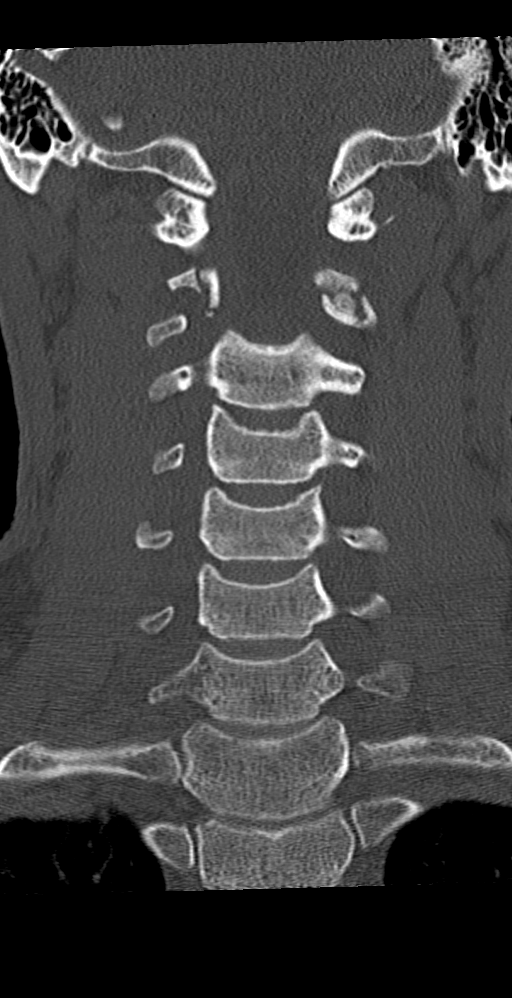
[im 31/51  bone]
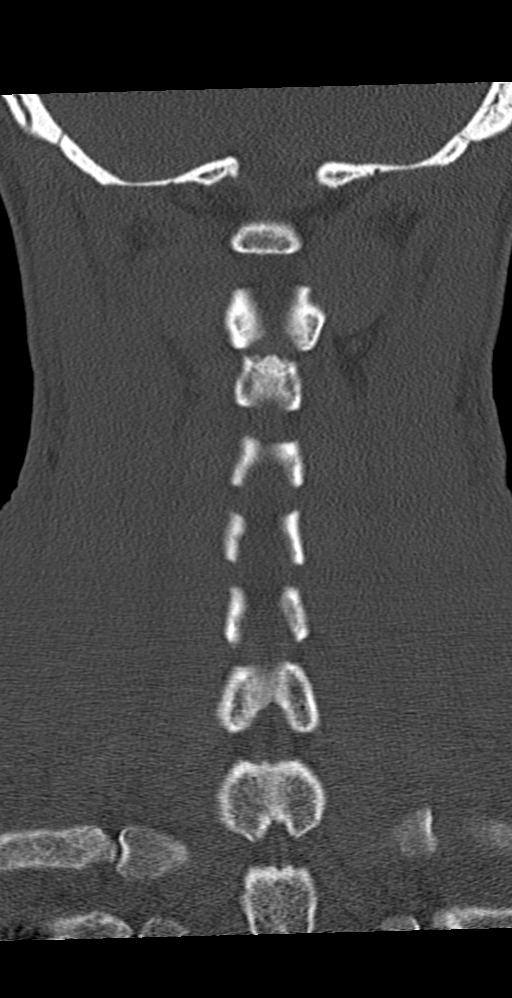

[Series 8: orthogonal bone · axial · 0.27mm/px · z∈[-287,-170]mm · 3 of 132 slices shown, 4 images]
[im 33/132  soft-tissue]
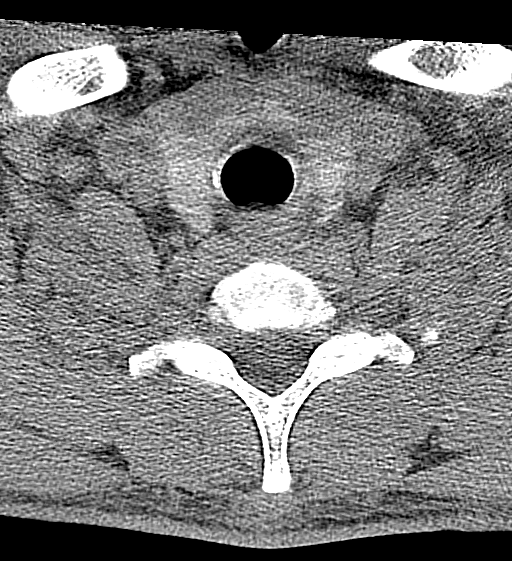
[im 33/132  bone]
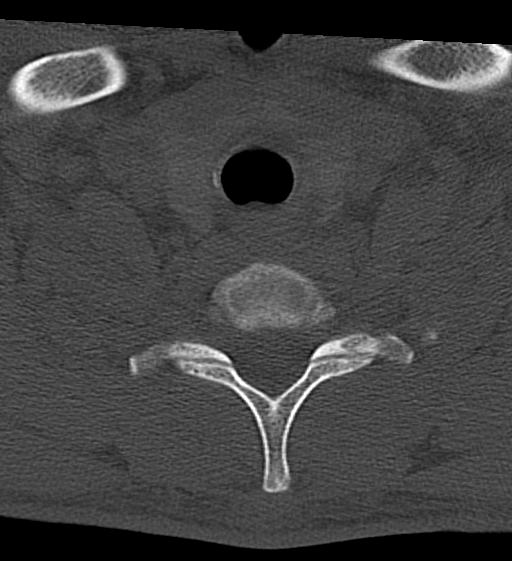
[im 66/132  bone]
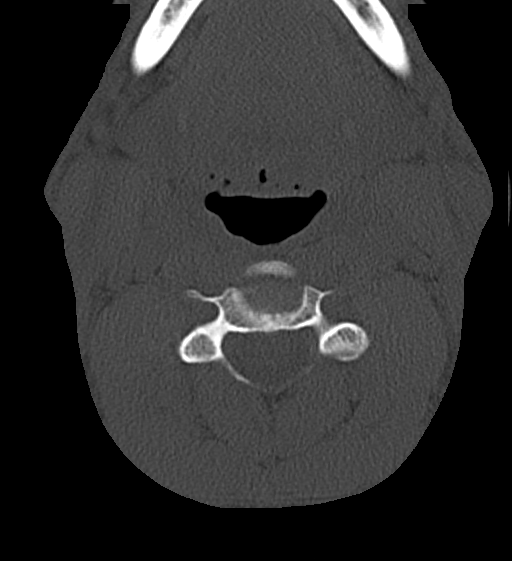
[im 99/132  bone]
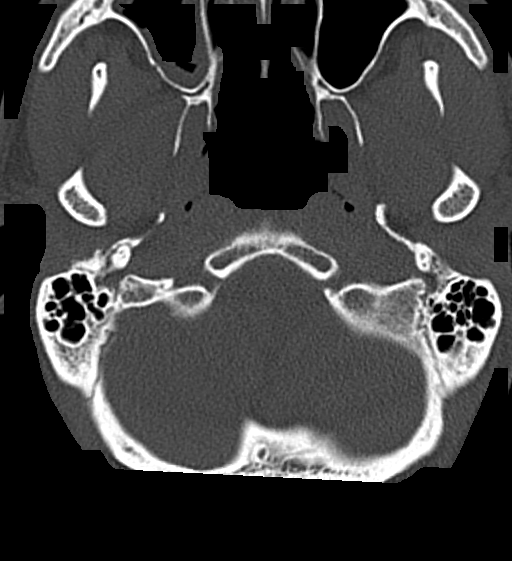

[11 of 33 positions shown; findings below may reference images not displayed]

FINDINGS: Alignment: Straightening of the normal cervical lordosis. 4 mm
anterolisthesis of C2 on C3, stable relative to recent CT.

Skull base and vertebrae: Previously identified bilateral C2
fractures again seen, relatively stable in position and alignment as
compared to recent CT. Some healing callus formation seen about the
fractures, right slightly greater than left. Persistent nonunion
noted. No new fracture or other osseous abnormality.

Soft tissues and spinal canal: Paraspinous soft tissues demonstrate
no acute abnormality. No abnormal prevertebral edema or swelling.
Spinal canal demonstrates no acute finding.

Disc levels: Unremarkable. No significant stenosis or new disc
pathology.

Upper chest: Visualized lung apices are clear.

Other: None.
IMPRESSION: 1. Subacute bilateral C2 fractures with persistent nonunion, stable
in position and alignment as compared to recent CT. Associated 4 mm
anterolisthesis of C2 on C3 is unchanged.
2. No other new osseous abnormality within the cervical spine. No
appreciable spinal stenosis

## 2019-01-25 IMAGING — CT CT LUMBAR SPINE WITHOUT CONTRAST
3 series · 10 of 33 positions shown, 11 images · non-contrast
Comparison: Prior CT from [DATE].

CLINICAL DATA: Initial evaluation for chronic lower back pain with
left lower extremity pain.

EXAM:
CT LUMBAR SPINE WITHOUT CONTRAST
TECHNIQUE: Multidetector CT imaging of the lumbar spine was performed without
intravenous contrast administration. Multiplanar CT image
reconstructions were also generated.

[Series 4: l spine soft · axial · 0.31mm/px · z∈[-719,-583]mm · 2 of 147 slices shown, 3 images]
[im 45/147  soft-tissue]
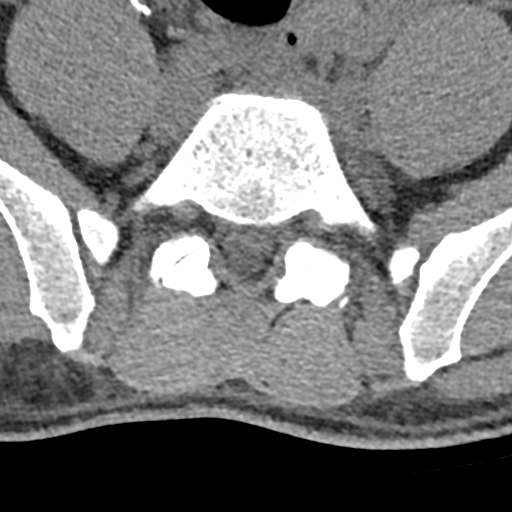
[im 45/147  bone]
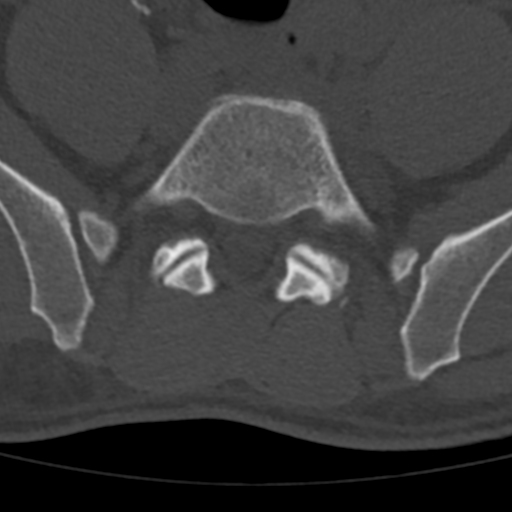
[im 113/147  bone]
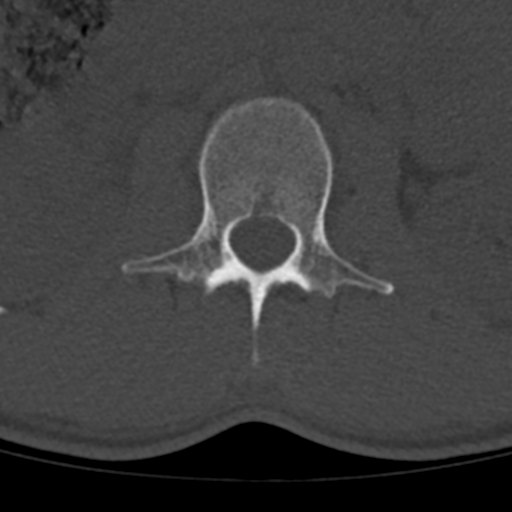

[Series 7: sagittal bone · sagittal · 0.27mm/px · 5 of 65 slices shown]
[im 22/65  bone]
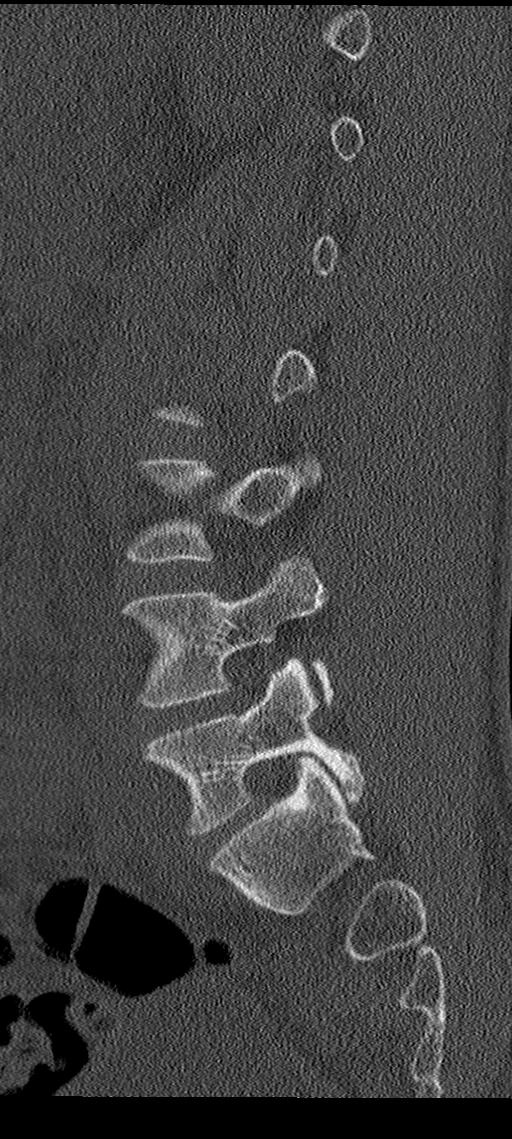
[im 27/65  bone]
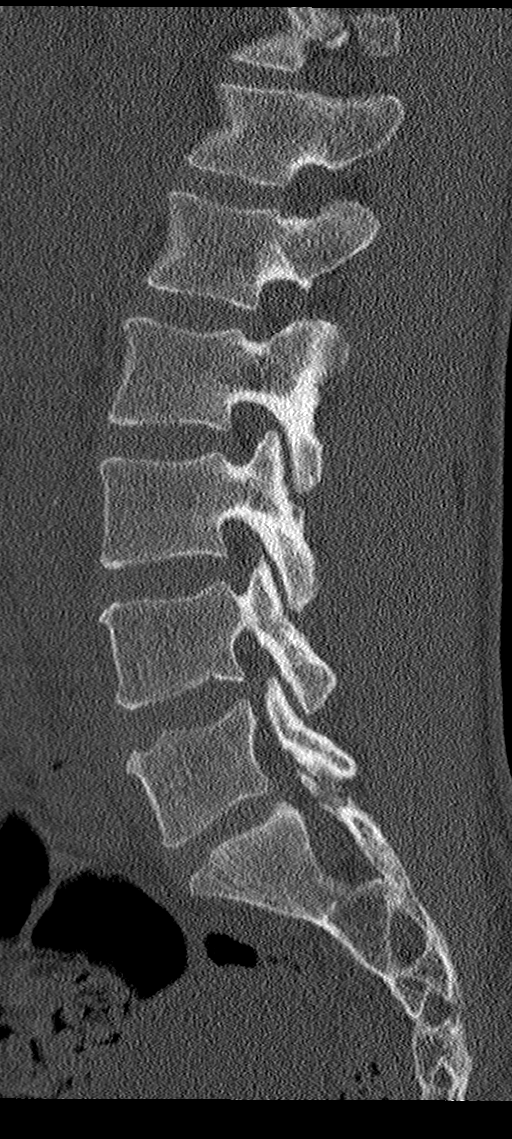
[im 33/65  bone]
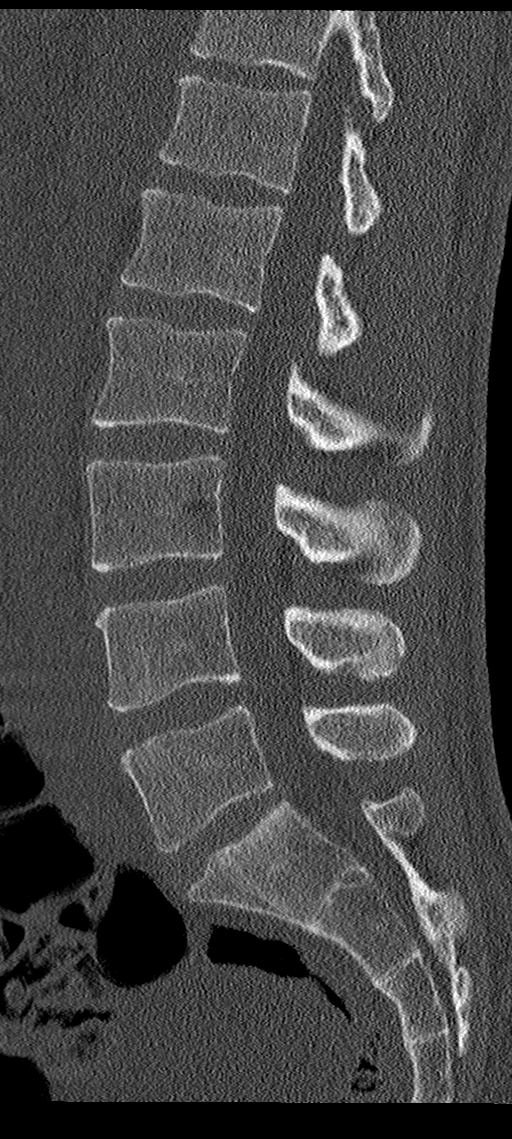
[im 38/65  bone]
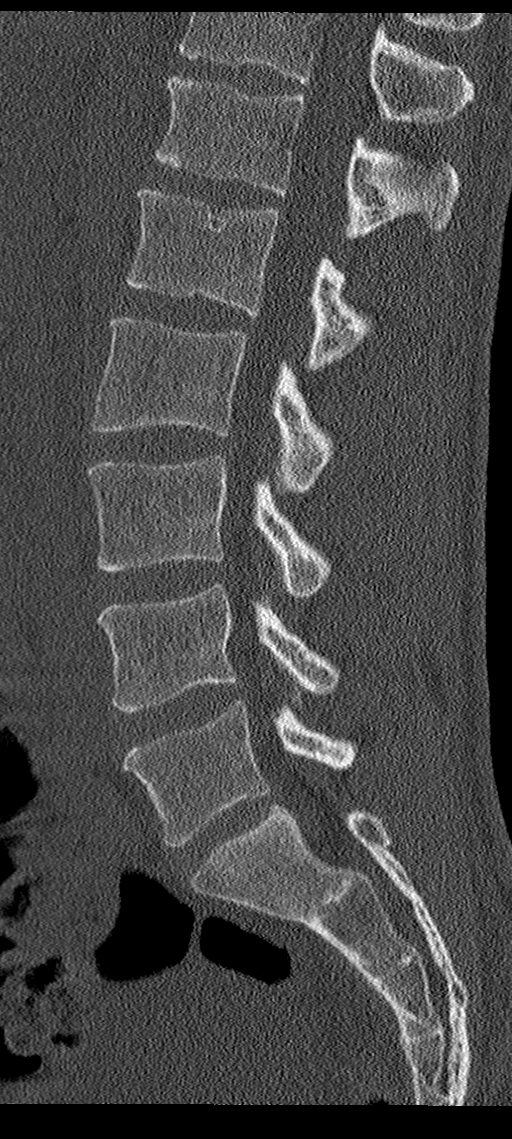
[im 43/65  bone]
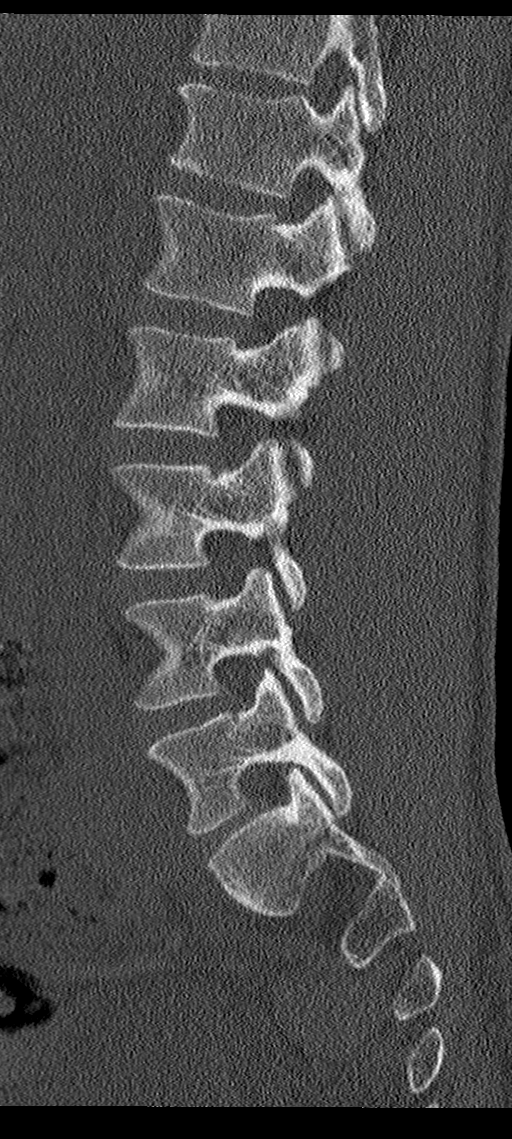

[Series 8: coronal bone · coronal · 0.32mm/px · 3 of 69 slices shown]
[im 14/69  bone]
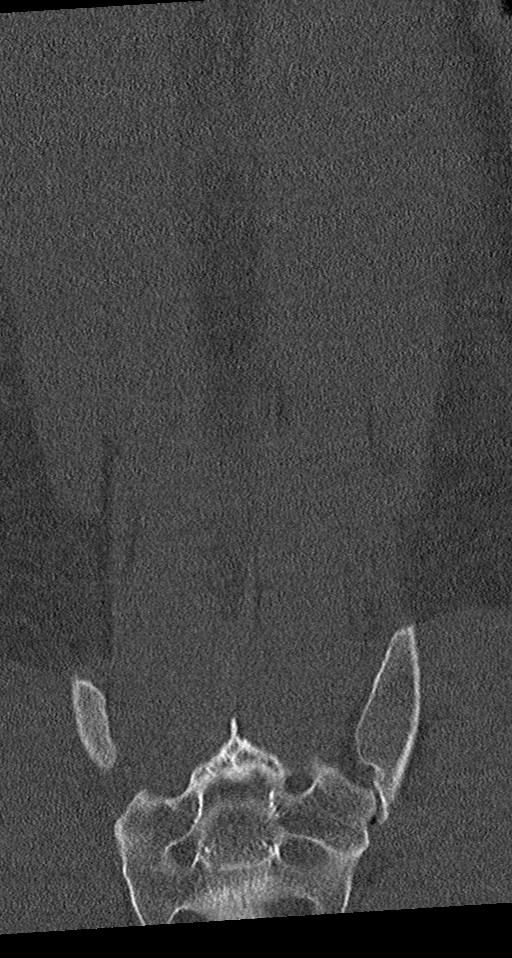
[im 28/69  bone]
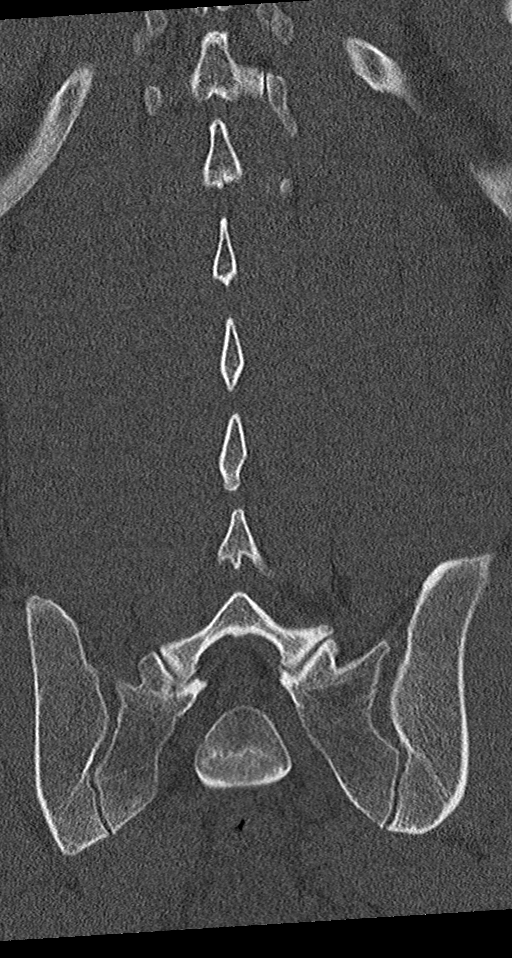
[im 41/69  bone]
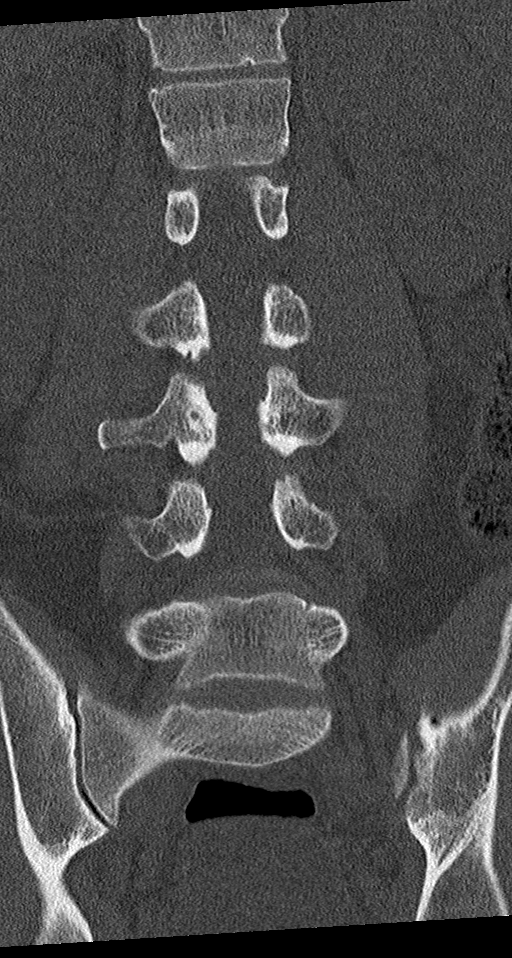

[10 of 33 positions shown; findings below may reference images not displayed]

FINDINGS: Segmentation: For purposes of this dictation, lowest well-formed
interspace will be labeled the L5-S1 level.

Alignment: Vertebral bodies normally aligned with preservation of
the normal lumbar lordosis. No listhesis or subluxation.

Vertebrae: Vertebral body height maintained without evidence for
acute or chronic fracture. Visualized sacrum and pelvis intact. SI
joints approximated. No discrete or worrisome osseous lesions.

Paraspinal and other soft tissues: Paraspinous soft tissues
demonstrate no acute finding. Visualized visceral structures and
intra-abdominal contents unremarkable.

Disc levels:

L1-2:  Unremarkable.

L2-3:  Unremarkable.

L3-4:  Unremarkable.

L4-5: Broad-based posterior disc bulge, slightly asymmetric to the
left. Probable superimposed left subarticular disc protrusion
extending into the left lateral recess, potentially affecting the
descending left L5 nerve root. Central canal remains patent. No
obvious significant foraminal stenosis.

L5-S1: Broad-based posterior disc bulge, closely approximating the
descending S1 nerve roots without frank neural impingement. Mild
facet hypertrophy. No significant stenosis.
IMPRESSION: 1. No acute osseous abnormality within the lumbar spine.
2. Suspected small left subarticular disc protrusion at L4-5,
potentially affecting the descending left L5 nerve root in the left
lateral recess.
3. Shallow posterior disc bulge at L5-S1, closely approximating the
descending S1 nerve roots without frank impingement.

## 2019-01-25 MED ORDER — PREDNISONE 20 MG PO TABS: 60.0000 mg | ORAL_TABLET | Freq: Every day | ORAL | 0 refills | Status: AC

## 2019-01-25 MED ORDER — KETOROLAC TROMETHAMINE 30 MG/ML IJ SOLN
30.0000 mg | Freq: Once | INTRAMUSCULAR | Status: AC
  Administered 2019-01-25: 01:00:00 30 mg via INTRAVENOUS
  Filled 2019-01-25: qty 1

## 2019-01-25 MED ORDER — PREDNISONE 20 MG PO TABS: 60.0000 mg | ORAL_TABLET | Freq: Every day | ORAL | 0 refills | Status: DC

## 2019-01-25 MED ORDER — CYCLOBENZAPRINE HCL 10 MG PO TABS: 10.0000 mg | ORAL_TABLET | Freq: Three times a day (TID) | ORAL | 0 refills | Status: DC | PRN

## 2019-01-25 MED ORDER — METHYLPREDNISOLONE SODIUM SUCC 125 MG IJ SOLR
125.0000 mg | Freq: Once | INTRAMUSCULAR | Status: AC
  Administered 2019-01-25: 125 mg via INTRAVENOUS
  Filled 2019-01-25: qty 2

## 2019-01-25 NOTE — ED Notes (Signed)
Patient transported to CT 
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
## 2019-03-18 ENCOUNTER — Emergency Department

## 2019-03-18 ENCOUNTER — Emergency Department
Admission: EM | Admit: 2019-03-18 | Discharge: 2019-03-18 | Disposition: A | Discharge status: Home / Self Care | Attending: Student in an Organized Health Care Education/Training Program | Admitting: Student in an Organized Health Care Education/Training Program

## 2019-03-18 ENCOUNTER — Other Ambulatory Visit: Payer: Self-pay

## 2019-03-18 ENCOUNTER — Encounter: Payer: Self-pay | Admitting: *Deleted

## 2019-03-18 DIAGNOSIS — S12100K Unspecified displaced fracture of second cervical vertebra, subsequent encounter for fracture with nonunion: Secondary | ICD-10-CM | POA: Diagnosis not present

## 2019-03-18 DIAGNOSIS — Z79899 Other long term (current) drug therapy: Secondary | ICD-10-CM | POA: Insufficient documentation

## 2019-03-18 DIAGNOSIS — Z87891 Personal history of nicotine dependence: Secondary | ICD-10-CM | POA: Insufficient documentation

## 2019-03-18 DIAGNOSIS — M542 Cervicalgia: Secondary | ICD-10-CM
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
## 2019-03-18 IMAGING — CR DG CERVICAL SPINE 2 OR 3 VIEWS
3 series · 3 of 3 positions shown · non-contrast
Comparison: None.

CLINICAL DATA: Neck pain

EXAM:
CERVICAL SPINE - 2-3 VIEW

[c-spine lat]
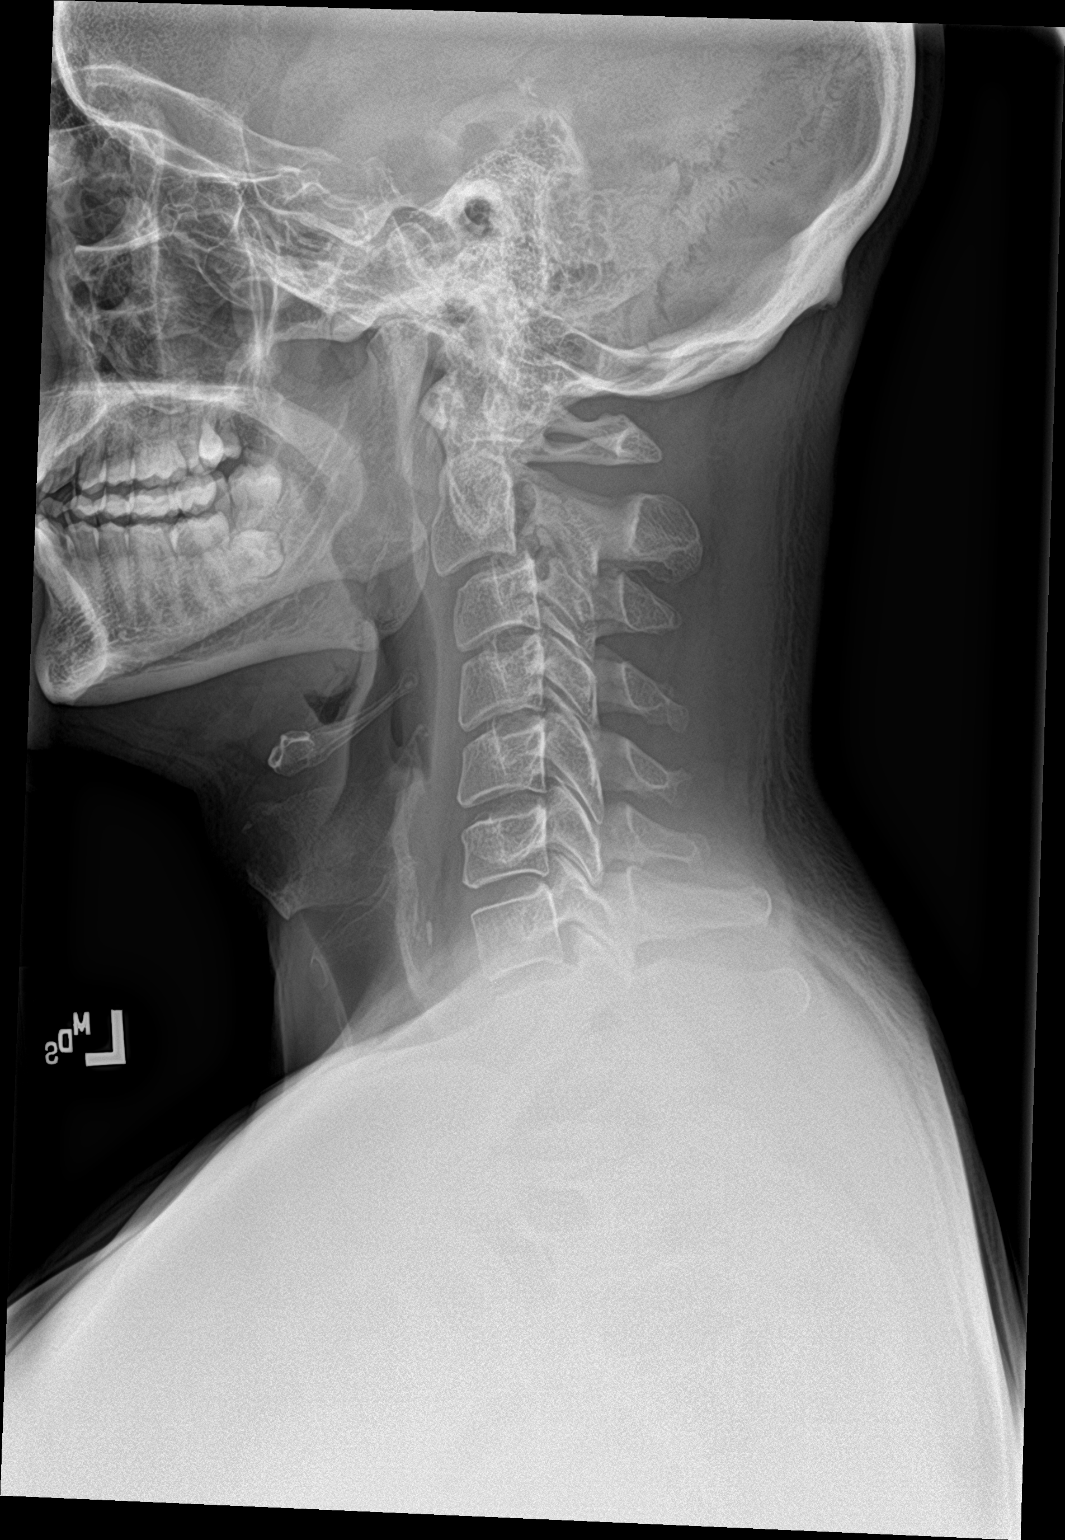

[c-spine ap]
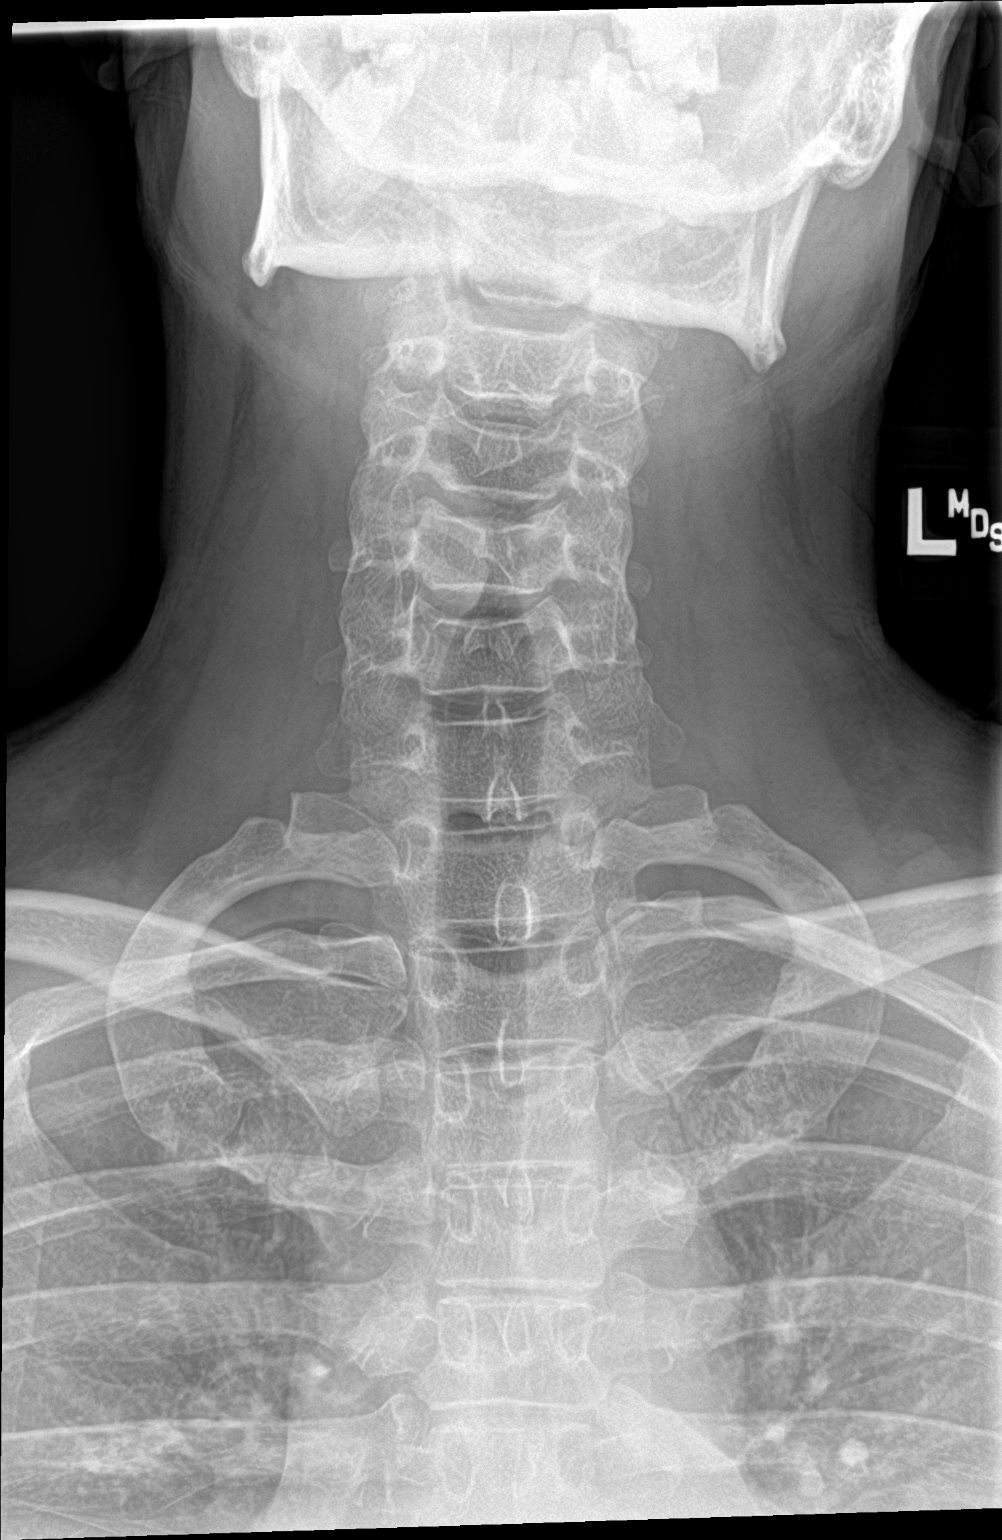

[c-spine open mouth]
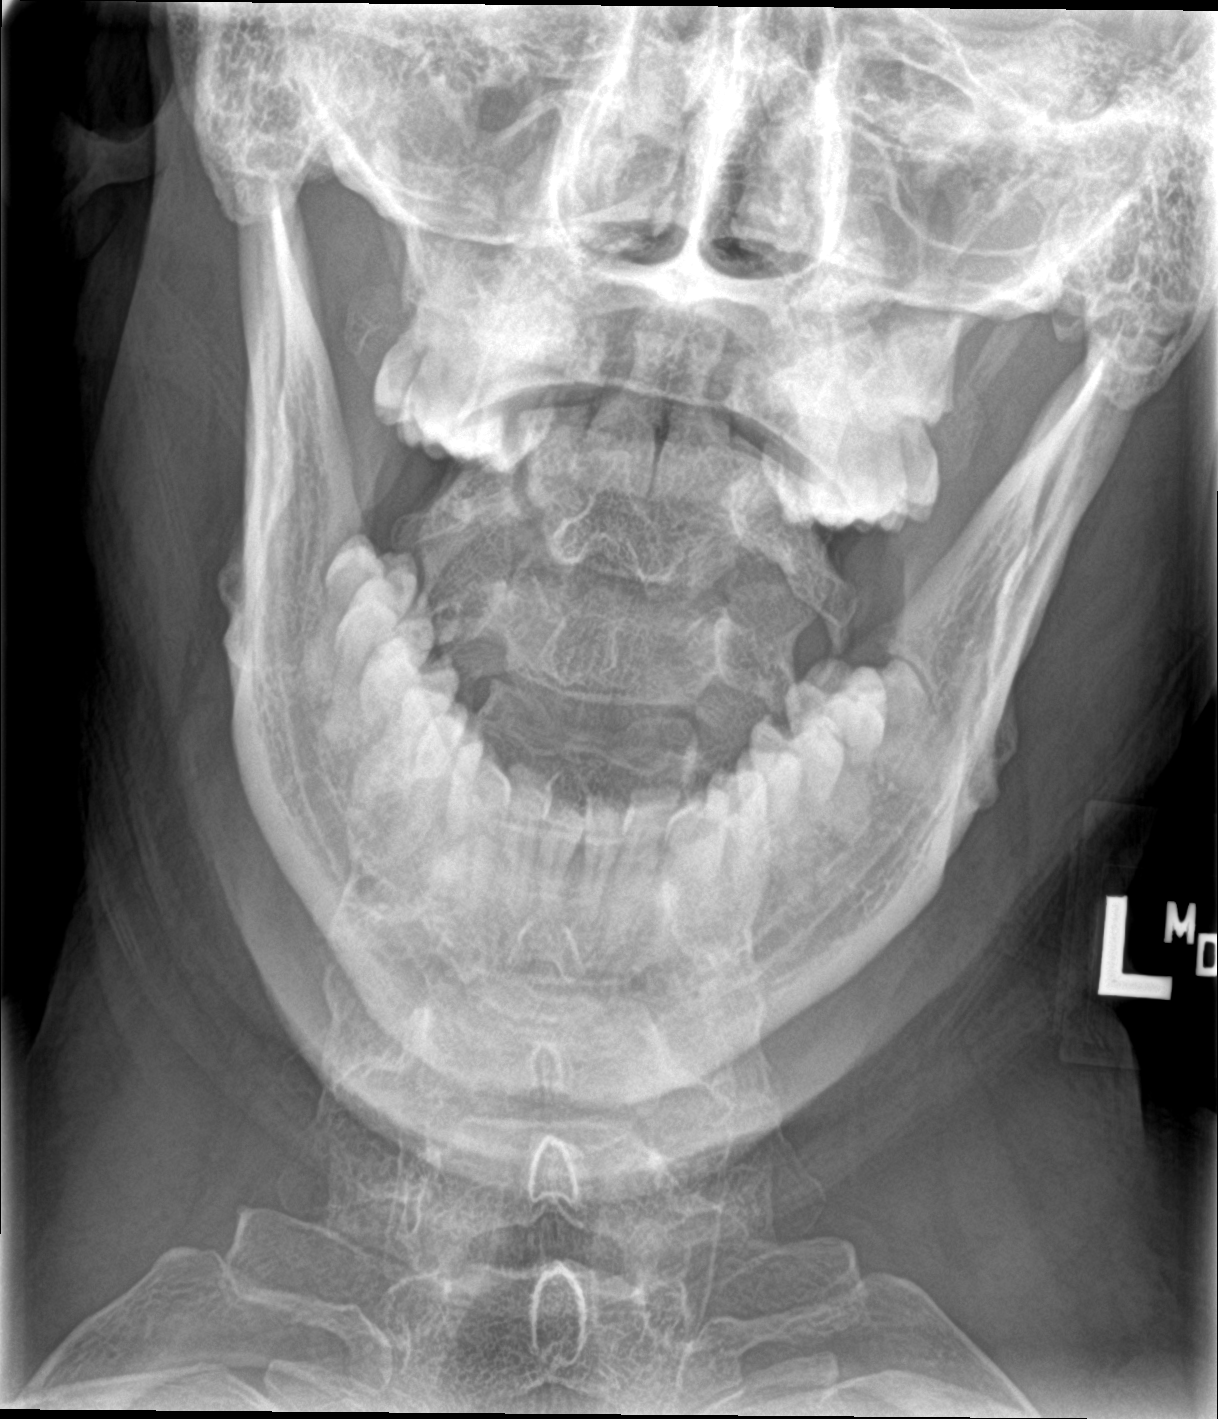

[3 of 3 positions shown; findings below may reference images not displayed]

FINDINGS: There is 4 mm of anterolisthesis of C2 on C3. No visible fracture.
Prevertebral soft tissues are normal. Disc spaces are maintained.
IMPRESSION: 4 mm of anterolisthesis at C2-3 without visible fracture. Cannot
exclude occult fracture or ligamentous injury. Recommend further
evaluation with MRI.
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
## 2019-12-08 ENCOUNTER — Other Ambulatory Visit: Payer: Self-pay

## 2019-12-08 ENCOUNTER — Emergency Department
Admission: EM | Admit: 2019-12-08 | Discharge: 2019-12-08 | Disposition: A | Discharge status: Home / Self Care | Attending: Emergency Medicine | Admitting: Emergency Medicine

## 2019-12-08 ENCOUNTER — Encounter: Payer: Self-pay | Admitting: Emergency Medicine

## 2019-12-08 DIAGNOSIS — R21 Rash and other nonspecific skin eruption: Secondary | ICD-10-CM | POA: Insufficient documentation

## 2019-12-08 DIAGNOSIS — Z79899 Other long term (current) drug therapy: Secondary | ICD-10-CM | POA: Insufficient documentation

## 2019-12-08 DIAGNOSIS — Z87891 Personal history of nicotine dependence: Secondary | ICD-10-CM | POA: Insufficient documentation

## 2019-12-08 MED ORDER — HYDROXYZINE HCL 50 MG PO TABS
50.0000 mg | ORAL_TABLET | Freq: Once | ORAL | Status: AC
  Administered 2019-12-08: 50 mg via ORAL
  Filled 2019-12-08: qty 1

## 2019-12-08 MED ORDER — HYDROXYZINE HCL 25 MG PO TABS: 25.0000 mg | ORAL_TABLET | Freq: Four times a day (QID) | ORAL | 0 refills | Status: AC | PRN

## 2019-12-08 NOTE — ED Notes (Signed)
Pt noted to be standing in hallway next to treatment room. Pt encouraged to go to treatment room to assess. Pt c/o rash that started this morning to face, arms and hands. Redness noted. RR even and unlabored.  After assessment, pt ambulatory back to hallway stating that he does not want to be in room. Pt redirected by multiple RN back to treatment room.

## 2019-12-08 NOTE — ED Provider Notes (Signed)
[facility] Emergency Department Provider Note   ____________________________________________   First MD Initiated Contact with Patient 12/08/19 1449     (approximate)  I have reviewed the triage vital signs and the nursing notes.   HISTORY  Chief Complaint Rash   HPI Adam Donovan is a 39 y.o. male presnet to the ED with complaint of rash to his body for several years.  Patient states that today he is once again itching.  He states that he was not doing anything at the time he began itching.  There is been no change in medication, soap, clothing or foods.  Patient states that he feels this is "something in his body".  Patient states that he scratches at it frequently.  He admits to drinking 1-2 beers per day to help control the itching.  Patient does admit to past IV drug use and currently takes Suboxone every 1 to 2 days to also help control his itching.  He also has been doing some methadone.  He denies any suicidal or homicidal ideations.  He was brought to the ED by [facility] only because he was having an argument with his father.  He was asked 3 times if he felt that he needed to be evaluated for psychiatric issues and he denied.   Clarification due to [facility] PD bringing him to the ED that patient is not under IVC'd.         Past Medical History:  Diagnosis Date  . Alcohol abuse    alcohol withdrawal  . IV drug abuse [facility])     Patient Active Problem List   Diagnosis Date Noted  . C2 cervical fracture ([facility]) 12/02/2018  . Abnormal liver function 12/02/2018  . Hypokalemia 12/02/2018  . Abrasions of multiple sites     Past Surgical History:  Procedure Laterality Date  . APPENDECTOMY      Prior to Admission medications   Medication Sig Start Date End Date Taking? Authorizing Provider  bacitracin ointment Apply topically 2 (two) times daily. 12/03/18   Burnadette Pop, MD  carbamazepine (TEGRETOL) 200 MG tablet 800mg  PO QD X 1D,  then 600mg  PO QD X 1D, then 400mg  QD X 1D, then 200mg  PO QD X 2D 06/15/17   , PA-C  hydrOXYzine (ATARAX/VISTARIL) 25 MG tablet Take 1 tablet (25 mg total) by mouth every 6 (six) hours as needed for itching. 12/08/19   , PA-C    Allergies Patient has no known allergies.  Family History  Family history unknown: Yes    Social History Social History   Tobacco Use  . Smoking status: Former Smoker    Packs/day: 1.00    Types: Cigarettes  . Smokeless tobacco: Former 08/13/17    Types: Fayrene Helper  . Vaping Use: Some days  Substance Use Topics  . Alcohol use: Not Currently    Comment: every other day  . Drug use: Not Currently    Types: Cocaine, IV    Comment: saboxone    Review of Systems Constitutional: No fever/chills Eyes: No visual changes. ENT: No sore throat. Cardiovascular: Denies chest pain. Respiratory: Denies shortness of breath. Gastrointestinal: No abdominal pain.  No nausea, no vomiting.   Musculoskeletal: Negative for back pain. Skin: Positive for sores. Neurological: Negative for headaches, focal weakness or numbness. Psychiatric:  Prior history delusions of parasites.  ____________________________________________   PHYSICAL EXAM:  VITAL SIGNS: ED Triage Vitals  Enc Vitals Group     BP  12/08/19 1430 (!) 143/97     Pulse Rate 12/08/19 1430 (!) 101     Resp 12/08/19 1430 16     Temp 12/08/19 1430 98.5 F (36.9 C)     Temp Source 12/08/19 1430 Oral     SpO2 12/08/19 1430 96 %     Weight 12/08/19 1431 200 lb (90.7 kg)     Height 12/08/19 1431 5\' 11"  (1.803 m)     Head Circumference --      Peak Flow --      Pain Score 12/08/19 1431 0     Pain Loc --      Pain Edu? --      Excl. in GC? --    Constitutional: Alert and oriented. Well appearing and in no acute distress.  Patient is frequently outside the room and walking in the hallway. Eyes: Conjunctivae are normal.  Head: Atraumatic. Nose: No  congestion/rhinnorhea. Neck: No stridor.   Cardiovascular: Normal rate, regular rhythm. Grossly normal heart sounds.  Good peripheral circulation. Respiratory: Normal respiratory effort.  No retractions. Lungs CTAB. Gastrointestinal: Soft and nontender. No distention.  Musculoskeletal: Patient is able to move upper and lower extremities with any difficulty.  Normal gait was noted in the hallway and in the exam room. Neurologic:  Normal speech and language. No gross focal neurologic deficits are appreciated. No gait instability. Skin:  Skin is warm, dry.  There are multiple open superficial lesions noted on the face, torso, upper and lower extremities.  No evidence of infection or drainage.  Skin is erythematous after patient scratches. Psychiatric: Mood and affect are normal. Speech and behavior are normal.  ____________________________________________   LABS (all labs ordered are listed, but only abnormal results are displayed)  Labs Reviewed - No data to display  PROCEDURES  Procedure(s) performed (including Critical Care):  Procedures   ____________________________________________   INITIAL IMPRESSION / ASSESSMENT AND PLAN / ED COURSE  As part of my medical decision making, I reviewed the following data within the electronic MEDICAL RECORD NUMBER Notes from prior ED visits and [place] Controlled Substance Database  39 year old male presents to the ED with complaint of a rash to his body that has been for approximately 2 years.  Patient states that it itches greatly.  He began itching worse after an argument with his father which is how the [place] PD got involved.  He was brought to the ED by PD for evaluation of his rash.  Patient was seen scratching at the areas frequently.  He admits to daily alcohol use along with Suboxone and/or methadone when he can get it.  Patient has not seen a dermatologist for these areas.  He is constantly scratching at these areas.  No evidence of infection  were noted on examination.  Patient refused lab work or further testing.  Patient was given Atarax 50 mg while in the ED.  He is encouraged to follow-up with one of the dermatologist listed on his discharge papers.  Is questionable if these lesions is related to his alcohol and drug use.  Patient did not demonstrate any suicidal or homicidal ideation.  After the Atarax was given patient seemed to be scratching less and more comfortable.  ____________________________________________   FINAL CLINICAL IMPRESSION(S) / ED DIAGNOSES  Final diagnoses:  Rash and nonspecific skin eruption     ED Discharge Orders         Ordered    hydrOXYzine (ATARAX/VISTARIL) 25 MG tablet  Every 6 hours PRN  Discontinue  Reprint     12/08/19 1522           Note:  This document was prepared using Dragon voice recognition software and may include unintentional dictation errors.    Tommi Rumps, PA-C 12/08/19 Shelton Silvas, MD 12/08/19 2006

## 2019-12-08 NOTE — Discharge Instructions (Signed)
Follow-up with one of the dermatologist listed on your discharge papers.  The [place] [facility] and [place] [facility] are 2 separate offices that can evaluate your skin.  Continue taking the Atarax every 6 hours as needed for itching.  Return to the emergency department if any severe worsening of your symptoms and especially if you wish to have lab work done.

## 2019-12-08 NOTE — ED Triage Notes (Signed)
Pt has rash to body.  Gets it intermittently.  Itches in nature. No fevers.  Arrived with [employer] voluntary after getting into argument with dad.  Denies SI/HI.  Verified X 3 does not wish to see psychiatry and would only like to be evaluated for rash. No longer uses IV drugs per pt

## 2019-12-16 ENCOUNTER — Emergency Department: Admission: EM | Admit: 2019-12-16 | Discharge: 2019-12-16 | Discharge status: Against Medical Advice | Payer: Self-pay

## 2019-12-16 NOTE — ED Triage Notes (Signed)
Cannot find pt for triage

## 2019-12-16 NOTE — ED Triage Notes (Signed)
Unable to find pt

## 2019-12-16 NOTE — ED Triage Notes (Signed)
Called no answer

## 2020-03-24 ENCOUNTER — Encounter (HOSPITAL_COMMUNITY): Payer: Self-pay | Admitting: Emergency Medicine

## 2020-03-24 ENCOUNTER — Emergency Department (HOSPITAL_COMMUNITY)
Admission: EM | Admit: 2020-03-24 | Discharge: 2020-03-24 | Disposition: A | Discharge status: Home / Self Care | Payer: Self-pay | Attending: Emergency Medicine | Admitting: Emergency Medicine

## 2020-03-24 ENCOUNTER — Other Ambulatory Visit: Payer: Self-pay

## 2020-03-24 DIAGNOSIS — R1110 Vomiting, unspecified: Secondary | ICD-10-CM | POA: Insufficient documentation

## 2020-03-24 DIAGNOSIS — Z20822 Contact with and (suspected) exposure to covid-19: Secondary | ICD-10-CM | POA: Insufficient documentation

## 2020-03-24 DIAGNOSIS — M545 Low back pain, unspecified: Secondary | ICD-10-CM | POA: Insufficient documentation

## 2020-03-24 DIAGNOSIS — Z87891 Personal history of nicotine dependence: Secondary | ICD-10-CM | POA: Insufficient documentation

## 2020-03-24 LAB — URINALYSIS, ROUTINE W REFLEX MICROSCOPIC
Bilirubin Urine: NEGATIVE
Glucose, UA: NEGATIVE mg/dL
Hgb urine dipstick: NEGATIVE
Ketones, ur: NEGATIVE mg/dL
Leukocytes,Ua: NEGATIVE
Nitrite: NEGATIVE
Protein, ur: NEGATIVE mg/dL
Specific Gravity, Urine: 1.018 (ref 1.005–1.030)
pH: 5 (ref 5.0–8.0)

## 2020-03-24 LAB — CBC
HCT: 38.9 % — ABNORMAL LOW (ref 39.0–52.0)
Hemoglobin: 12.4 g/dL — ABNORMAL LOW (ref 13.0–17.0)
MCH: 28.2 pg (ref 26.0–34.0)
MCHC: 31.9 g/dL (ref 30.0–36.0)
MCV: 88.6 fL (ref 80.0–100.0)
Platelets: 211 10*3/uL (ref 150–400)
RBC: 4.39 MIL/uL (ref 4.22–5.81)
RDW: 13.7 % (ref 11.5–15.5)
WBC: 4.3 10*3/uL (ref 4.0–10.5)
nRBC: 0 % (ref 0.0–0.2)

## 2020-03-24 LAB — COMPREHENSIVE METABOLIC PANEL
ALT: 35 U/L (ref 0–44)
AST: 32 U/L (ref 15–41)
Albumin: 3.4 g/dL — ABNORMAL LOW (ref 3.5–5.0)
Alkaline Phosphatase: 70 U/L (ref 38–126)
Anion gap: 9 (ref 5–15)
BUN: 7 mg/dL (ref 6–20)
CO2: 28 mmol/L (ref 22–32)
Calcium: 8.9 mg/dL (ref 8.9–10.3)
Chloride: 101 mmol/L (ref 98–111)
Creatinine, Ser: 0.76 mg/dL (ref 0.61–1.24)
GFR, Estimated: >60 mL/min (ref >60)
Glucose, Bld: 105 mg/dL — ABNORMAL HIGH (ref 70–99)
Potassium: 4.1 mmol/L (ref 3.5–5.1)
Sodium: 138 mmol/L (ref 135–145)
Total Bilirubin: 0.4 mg/dL (ref 0.3–1.2)
Total Protein: 6.6 g/dL (ref 6.5–8.1)

## 2020-03-24 LAB — LIPASE, BLOOD: Lipase: 30 U/L (ref 11–51)

## 2020-03-24 LAB — RESPIRATORY PANEL BY RT PCR (FLU A&B, COVID)
Influenza A by PCR: NEGATIVE
Influenza B by PCR: NEGATIVE
SARS Coronavirus 2 by RT PCR: NEGATIVE

## 2020-03-24 MED ORDER — KETOROLAC TROMETHAMINE 30 MG/ML IJ SOLN
30.0000 mg | Freq: Once | INTRAMUSCULAR | Status: DC
  Filled 2020-03-24: qty 1
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
## 2020-03-24 NOTE — ED Triage Notes (Signed)
Pt reports feeling poorly, states he has had emesis X1, hot flashes/chills, lower back pain and "just feel really bad."

## 2020-03-24 NOTE — ED Notes (Signed)
Went to give pt pain shot , pt not in room

## 2020-04-18 ENCOUNTER — Emergency Department (HOSPITAL_COMMUNITY)
Admission: EM | Admit: 2020-04-18 | Discharge: 2020-04-18 | Disposition: A | Discharge status: Home / Self Care | Payer: Self-pay | Attending: Emergency Medicine | Admitting: Emergency Medicine

## 2020-04-18 ENCOUNTER — Encounter (HOSPITAL_COMMUNITY): Payer: Self-pay | Admitting: Emergency Medicine

## 2020-04-18 ENCOUNTER — Other Ambulatory Visit: Payer: Self-pay

## 2020-04-18 DIAGNOSIS — J069 Acute upper respiratory infection, unspecified: Secondary | ICD-10-CM | POA: Insufficient documentation

## 2020-04-18 DIAGNOSIS — Z20822 Contact with and (suspected) exposure to covid-19: Secondary | ICD-10-CM | POA: Insufficient documentation

## 2020-04-18 DIAGNOSIS — Z87891 Personal history of nicotine dependence: Secondary | ICD-10-CM | POA: Insufficient documentation

## 2020-04-18 LAB — RESPIRATORY PANEL BY RT PCR (FLU A&B, COVID)
Influenza A by PCR: NEGATIVE
Influenza B by PCR: NEGATIVE
SARS Coronavirus 2 by RT PCR: NEGATIVE

## 2020-04-18 MED ORDER — ACETAMINOPHEN 500 MG PO TABS
500.0000 mg | ORAL_TABLET | Freq: Once | ORAL | Status: AC
  Administered 2020-04-18: 500 mg via ORAL
  Filled 2020-04-18: qty 1

## 2020-04-18 NOTE — ED Triage Notes (Signed)
Patient reports cough onset this week , respirations unlabored , denies fever or chills . Patient refused chest x-ray .

## 2020-04-18 NOTE — ED Provider Notes (Signed)
[facility] EMERGENCY DEPARTMENT Provider Note   CSN: [id number] Arrival date & time: 04/18/20  8916     History Chief Complaint  Patient presents with  . Cough    Adam Donovan is a 39 y.o. male with no pertinent past medical history that presents the emergency department today for congestion, cough and fatigue.  Patient states that he is having the symptoms for the past couple of days, states that he has tried TheraFlu without much relief.  Has not been vaccinated against Covid.  Denies any sick contacts that he is aware of.  States that TheraFlu has not been working, therefore came here to get tested.  States that he primarily came here to get Covid tested.  Patient refused chest x-ray in triage.  States that he just wants to get tested for Covid for work.  States that cough is mild, is productive.  No hemoptysis.  States that congestion is worse.. Denies any sore throat, difficulty breathing, shortness of breath, chest pain.  Denies any myalgias, headache, neck pain, vision changes.  Denies any numbness or tingling, gait abnormality.  Patient states that he is generally healthy.  Was normal health before this.  Denies any fevers, chills, nausea, vomiting, diarrhea.  No on any medication daily, including no new blood pressure medication.  HPI     Past Medical History:  Diagnosis Date  . Alcohol abuse    alcohol withdrawal  . IV drug abuse [facility])     Patient Active Problem List   Diagnosis Date Noted  . C2 cervical fracture (HCC) 12/02/2018  . Abnormal liver function 12/02/2018  . Hypokalemia 12/02/2018  . Abrasions of multiple sites     Past Surgical History:  Procedure Laterality Date  . APPENDECTOMY         Family History  Family history unknown: Yes    Social History   Tobacco Use  . Smoking status: Former Smoker    Packs/day: 1.00    Types: Cigarettes  . Smokeless tobacco: Former Neurosurgeon    Types: Engineer, drilling  . Vaping Use: Some  days  Substance Use Topics  . Alcohol use: Not Currently    Comment: every other day  . Drug use: Not Currently    Types: Cocaine, IV    Comment: saboxone    Home Medications Prior to Admission medications   Medication Sig Start Date End Date Taking? Authorizing Provider  bacitracin ointment Apply topically 2 (two) times daily. 12/03/18   Burnadette Pop, MD  carbamazepine (TEGRETOL) 200 MG tablet 800mg  PO QD X 1D, then 600mg  PO QD X 1D, then 400mg  QD X 1D, then 200mg  PO QD X 2D 06/15/17   , PA-C  hydrOXYzine (ATARAX/VISTARIL) 25 MG tablet Take 1 tablet (25 mg total) by mouth every 6 (six) hours as needed for itching. 12/08/19   , PA-C    Allergies    Patient has no known allergies.  Review of Systems   Review of Systems  Constitutional: Positive for fatigue. Negative for chills, diaphoresis and fever.  HENT: Positive for congestion. Negative for sore throat and trouble swallowing.   Eyes: Negative for pain and visual disturbance.  Respiratory: Positive for cough. Negative for shortness of breath and wheezing.   Cardiovascular: Negative for chest pain, palpitations and leg swelling.  Gastrointestinal: Negative for abdominal distention, abdominal pain, diarrhea, nausea and vomiting.  Genitourinary: Negative for difficulty urinating.  Musculoskeletal: Negative for back pain, neck pain and neck  stiffness.  Skin: Negative for pallor.  Neurological: Negative for dizziness, speech difficulty, weakness and headaches.  Psychiatric/Behavioral: Negative for confusion.    Physical Exam Updated Vital Signs BP 140/87   Pulse 87   Temp 98 F (36.7 C)   Resp 16   Ht 5\' 11"  (1.803 m)   Wt 95 kg   SpO2 99%   BMI 29.21 kg/m   Physical Exam Constitutional:      General: He is not in acute distress.    Appearance: Normal appearance. He is not ill-appearing, toxic-appearing or diaphoretic.     Comments: Patient without acute respiratory stress.  Patient is  sitting comfortably in bed, no tripoding, use of accessory muscles.  Patient is speaking to me in full sentences.  Handling secretions well.  HENT:     Head: Normocephalic and atraumatic.     Jaw: There is normal jaw occlusion. No trismus, swelling or malocclusion.     Nose: Congestion present. No rhinorrhea.     Right Sinus: No maxillary sinus tenderness or frontal sinus tenderness.     Left Sinus: No maxillary sinus tenderness or frontal sinus tenderness.     Mouth/Throat:     Mouth: Mucous membranes are moist. No oral lesions.     Dentition: Normal dentition.     Tongue: No lesions.     Palate: No mass and lesions.     Pharynx: Oropharynx is clear. Uvula midline. No pharyngeal swelling, oropharyngeal exudate, posterior oropharyngeal erythema or uvula swelling.     Tonsils: No tonsillar exudate or tonsillar abscesses. 1+ on the right. 1+ on the left.  Eyes:     General: No visual field deficit.       Right eye: No discharge.        Left eye: No discharge.     Extraocular Movements: Extraocular movements intact.     Conjunctiva/sclera: Conjunctivae normal.     Pupils: Pupils are equal, round, and reactive to light.  Cardiovascular:     Rate and Rhythm: Normal rate and regular rhythm.     Pulses: Normal pulses.     Heart sounds: Normal heart sounds. No murmur heard.  No friction rub. No gallop.   Pulmonary:     Effort: Pulmonary effort is normal. No respiratory distress.     Breath sounds: Normal breath sounds. No stridor. No wheezing, rhonchi or rales.  Chest:     Chest wall: No tenderness.  Abdominal:     General: Abdomen is flat. Bowel sounds are normal. There is no distension.     Palpations: Abdomen is soft.     Tenderness: There is no abdominal tenderness. There is no right CVA tenderness or left CVA tenderness.  Musculoskeletal:        General: No swelling or tenderness. Normal range of motion.     Cervical back: Normal range of motion. No rigidity or tenderness.      Right lower leg: No edema.     Left lower leg: No edema.  Lymphadenopathy:     Cervical: No cervical adenopathy.  Skin:    General: Skin is warm and dry.     Capillary Refill: Capillary refill takes less than 2 seconds.     Findings: No erythema or rash.  Neurological:     General: No focal deficit present.     Mental Status: He is alert and oriented to person, place, and time.     Cranial Nerves: Cranial nerves are intact. No cranial nerve deficit or facial  asymmetry.     Motor: Motor function is intact. No weakness.     Coordination: Coordination is intact.     Gait: Gait is intact. Gait normal.  Psychiatric:        Mood and Affect: Mood normal.     ED Results / Procedures / Treatments   Labs (all labs ordered are listed, but only abnormal results are displayed) Labs Reviewed  RESPIRATORY PANEL BY RT PCR (FLU A&B, COVID)    EKG None  Radiology No results found.  Procedures Procedures (including critical care time)  Medications Ordered in ED Medications  acetaminophen (TYLENOL) tablet 500 mg (500 mg Oral Given 04/18/20 1324)    ED Course  I have reviewed the triage vital signs and the nursing notes.  Pertinent labs & imaging results that were available during my care of the patient were reviewed by me and considered in my medical decision making (see chart for details).    MDM Rules/Calculators/A&P                         KYSEN WETHERINGTON is a 39 y.o. male with no pertinent past medical history that presents the emergency department today for congestion, cough and fatigue.  Physical exam benign, patient appears very well.  Patient here for Covid test, Covid test pending.  Covid test, flu test negative.  Patient most likely has upper respiratory infection, did discuss this with patient.  Symptomatic treatment discussed, patient ready for discharge.  Doubt need for further emergent work up at this time. I explained the diagnosis and have given explicit precautions  to return to the ER including for any other new or worsening symptoms. The patient understands and accepts the medical plan as it's been dictated and I have answered their questions. Discharge instructions concerning home care and prescriptions have been given. The patient is STABLE and is discharged to home in good condition.    Final Clinical Impression(s) / ED Diagnoses Final diagnoses:  Viral upper respiratory tract infection    Rx / DC Orders ED Discharge Orders    None       Farrel Gordon, PA-C 04/18/20 1031    Jacalyn Lefevre, MD 04/18/20 1117

## 2020-04-18 NOTE — ED Notes (Signed)
Mo answer when pt called for a room

## 2020-04-18 NOTE — Discharge Instructions (Addendum)
Your Covid test and flu test was negative.  You most likely have an upper respiratory infection, this could also be viral.  Get plenty of rest and drink plenty of water.  You were seen today for an upper respiratory infection. Use Flonase as directed for your congestion and Mucinex/Robitussin for your cough. Continue to stay well-hydrated. Take benadryl or other antihistamine to decrease secretions and for watery itchy eyes. Continued to alternate between Tylenol and ibuprofen for pain. Followup with your primary care doctor in 5-7 days or referral for urgent care for recheck of ongoing symptoms however return to emergency department for emergent changing or worsening of symptoms- example: shortness of breath, chest pain, difficulty swallowing, muffled voice.

## 2020-05-05 ENCOUNTER — Emergency Department (HOSPITAL_COMMUNITY)
Admission: EM | Admit: 2020-05-05 | Discharge: 2020-05-05 | Discharge status: Against Medical Advice | Payer: Self-pay | Attending: Emergency Medicine | Admitting: Emergency Medicine

## 2020-05-05 NOTE — ED Notes (Signed)
Patient walked out of department before triage.

## 2020-07-14 ENCOUNTER — Emergency Department
Admission: EM | Admit: 2020-07-14 | Discharge: 2020-07-15 | Disposition: A | Discharge status: Home / Self Care | Payer: Self-pay | Attending: Emergency Medicine | Admitting: Emergency Medicine

## 2020-07-14 ENCOUNTER — Other Ambulatory Visit: Payer: Self-pay

## 2020-07-14 ENCOUNTER — Encounter: Payer: Self-pay | Admitting: *Deleted

## 2020-07-14 DIAGNOSIS — F1410 Cocaine abuse, uncomplicated: Secondary | ICD-10-CM

## 2020-07-14 DIAGNOSIS — F19251 Other psychoactive substance dependence with psychoactive substance-induced psychotic disorder with hallucinations: Secondary | ICD-10-CM | POA: Insufficient documentation

## 2020-07-14 DIAGNOSIS — Z87891 Personal history of nicotine dependence: Secondary | ICD-10-CM | POA: Insufficient documentation

## 2020-07-14 DIAGNOSIS — R4689 Other symptoms and signs involving appearance and behavior: Secondary | ICD-10-CM

## 2020-07-14 DIAGNOSIS — F1994 Other psychoactive substance use, unspecified with psychoactive substance-induced mood disorder: Secondary | ICD-10-CM

## 2020-07-14 DIAGNOSIS — F19959 Other psychoactive substance use, unspecified with psychoactive substance-induced psychotic disorder, unspecified: Secondary | ICD-10-CM

## 2020-07-14 DIAGNOSIS — F1910 Other psychoactive substance abuse, uncomplicated: Secondary | ICD-10-CM

## 2020-07-14 DIAGNOSIS — F10129 Alcohol abuse with intoxication, unspecified: Secondary | ICD-10-CM | POA: Insufficient documentation

## 2020-07-14 DIAGNOSIS — F1510 Other stimulant abuse, uncomplicated: Secondary | ICD-10-CM

## 2020-07-14 LAB — CBC
HCT: 44.3 % (ref 39.0–52.0)
Hemoglobin: 15 g/dL (ref 13.0–17.0)
MCH: 29.4 pg (ref 26.0–34.0)
MCHC: 33.9 g/dL (ref 30.0–36.0)
MCV: 86.9 fL (ref 80.0–100.0)
Platelets: 241 10*3/uL (ref 150–400)
RBC: 5.1 MIL/uL (ref 4.22–5.81)
RDW: 13 % (ref 11.5–15.5)
WBC: 4.1 10*3/uL (ref 4.0–10.5)
nRBC: 0 % (ref 0.0–0.2)

## 2020-07-14 LAB — URINE DRUG SCREEN, QUALITATIVE (ARMC ONLY)
Amphetamines, Ur Screen: POSITIVE — AB
Barbiturates, Ur Screen: NOT DETECTED
Benzodiazepine, Ur Scrn: NOT DETECTED
Cannabinoid 50 Ng, Ur ~~LOC~~: NOT DETECTED
Cocaine Metabolite,Ur ~~LOC~~: POSITIVE — AB
MDMA (Ecstasy)Ur Screen: NOT DETECTED
Methadone Scn, Ur: NOT DETECTED
Opiate, Ur Screen: NOT DETECTED
Phencyclidine (PCP) Ur S: NOT DETECTED
Tricyclic, Ur Screen: NOT DETECTED

## 2020-07-14 LAB — COMPREHENSIVE METABOLIC PANEL
ALT: 56 U/L — ABNORMAL HIGH (ref 0–44)
AST: 53 U/L — ABNORMAL HIGH (ref 15–41)
Albumin: 3.8 g/dL (ref 3.5–5.0)
Alkaline Phosphatase: 85 U/L (ref 38–126)
Anion gap: 8 (ref 5–15)
BUN: 15 mg/dL (ref 6–20)
CO2: 25 mmol/L (ref 22–32)
Calcium: 8.9 mg/dL (ref 8.9–10.3)
Chloride: 104 mmol/L (ref 98–111)
Creatinine, Ser: 0.95 mg/dL (ref 0.61–1.24)
GFR, Estimated: >60 mL/min (ref >60)
Glucose, Bld: 110 mg/dL — ABNORMAL HIGH (ref 70–99)
Potassium: 4.6 mmol/L (ref 3.5–5.1)
Sodium: 137 mmol/L (ref 135–145)
Total Bilirubin: 0.5 mg/dL (ref 0.3–1.2)
Total Protein: 7.4 g/dL (ref 6.5–8.1)

## 2020-07-14 LAB — ETHANOL: Alcohol, Ethyl (B): <10 mg/dL (ref <10)
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
## 2020-07-14 NOTE — ED Notes (Signed)
Patient belongings: Burna Cash, blue jeans with black belt. Black velcro case on belt. White footies, black tube socks, black underware. Black long johns, white t-shirt, grey long sleeve shirt orange chain with superman charm, and black hoodie.AS
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
## 2020-07-15 DIAGNOSIS — R4689 Other symptoms and signs involving appearance and behavior: Secondary | ICD-10-CM

## 2020-07-15 DIAGNOSIS — F1410 Cocaine abuse, uncomplicated: Secondary | ICD-10-CM

## 2020-07-15 DIAGNOSIS — F1510 Other stimulant abuse, uncomplicated: Secondary | ICD-10-CM

## 2020-07-15 DIAGNOSIS — F1910 Other psychoactive substance abuse, uncomplicated: Secondary | ICD-10-CM

## 2020-07-15 DIAGNOSIS — F1994 Other psychoactive substance use, unspecified with psychoactive substance-induced mood disorder: Secondary | ICD-10-CM

## 2020-07-15 NOTE — Discharge Instructions (Addendum)
You are cleared by the psych team for discharge home
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
## 2020-07-15 NOTE — ED Provider Notes (Signed)
Patient was seen and evaluated by the psychiatric team and cleared for discharge.  Reviewed labs and they were all reassuring.   Concha Se, MD 07/15/20 [phone]

## 2020-07-15 NOTE — ED Notes (Signed)
Pt dressing for discharge.  

## 2020-07-15 NOTE — BH Assessment (Signed)
Comprehensive Clinical Assessment (CCA) Note  07/15/2020 Adam Donovan [id number]  Chief Complaint: Patient is a 40 year old male presenting to [facility] ED under IVC. Pt to ED under IVC that father took out reports pt is using methamphetamine and has become violent threatening his mother. Pt confirms that he does use drugs and alcohol but did not threaten his mother and does not have Homicidal ideations. Pt also agrees that he slept outside last night but because he had nowhere else to go and denies feeling paranoid or like anyone is following him. Pt is calm in triage. Last use of drugs was approximately a week ago and last beer was yesterday. Difficulty sleeping also reported. During assessment patient appears alert and oriented x4, calm and cooperative, mood is pleasant. Patient reports "I got into a argument with my mother and now my father is lying on me." Patient currently denies threatening his mother. Patient denies using any substances tonight. Patient UDS is positive for Amphetamines and Cocaine. When asked if patient is willing to get help for his substance abuse patient denies wanting help. Patient denies SI/HI/AH/VH and does not appear to be responding to any internal or external stimuli  Per Psyc NP Rashaun Dixon patient will be observed overnight and reassessed  Chief Complaint  Patient presents with  . IVC   Visit Diagnosis: Polysubstance Abuse   CCA Screening, Triage and Referral (STR)  Patient Reported Information How did you hear about [place]? Other (Comment)  Referral name: No data recorded Referral phone number: No data recorded  Whom do you see for routine medical problems? Other (Comment)  Practice/Facility Name: No data recorded Practice/Facility Phone Number: No data recorded Name of Contact: No data recorded Contact Number: No data recorded Contact Fax Number: No data recorded Prescriber Name: No data recorded Prescriber Address (if known): No data recorded  What  Is the Reason for Your Visit/Call Today? No data recorded How Long Has This Been Causing You Problems? > than 6 months  What Do You Feel Would Help You the Most Today? Therapy; Medication; Assessment Only   Have You Recently Been in Any Inpatient Treatment (Hospital/Detox/Crisis Center/28-Day Program)? No  Name/Location of Program/Hospital:No data recorded How Long Were You There? No data recorded When Were You Discharged? No data recorded  Have You Ever Received Services From [facility] Before? No  Who Do You See at [facility]? No data recorded  Have You Recently Had Any Thoughts About Hurting Yourself? No  Are You Planning to Commit Suicide/Harm Yourself At This time? No   Have you Recently Had Thoughts About Hurting Someone Karolee Ohs? No  Explanation: No data recorded  Have You Used Any Alcohol or Drugs in the Past 24 Hours? No  How Long Ago Did You Use Drugs or Alcohol? No data recorded What Did You Use and How Much? No data recorded  Do You Currently Have a Therapist/Psychiatrist? No  Name of Therapist/Psychiatrist: No data recorded  Have You Been Recently Discharged From Any Office Practice or Programs? No  Explanation of Discharge From Practice/Program: No data recorded    CCA Screening Triage Referral Assessment Type of Contact: Face-to-Face  Is this Initial or Reassessment? No data recorded Date Telepsych consult ordered in [facility]:  No data recorded Time Telepsych consult ordered in [facility]:  No data recorded  Patient Reported Information Reviewed? Yes  Patient Left Without Being Seen? No data recorded Reason for Not Completing Assessment: No data recorded  Collateral Involvement: No data recorded  Does  Patient Have a Automotive engineer Guardian? No data recorded Name and Contact of Legal Guardian: No data recorded If Minor and Not Living with Parent(s), Who has Custody? No data recorded Is CPS involved or ever been involved? Never  Is APS involved or ever  been involved? Never   Patient Determined To Be At Risk for Harm To Self or Others Based on Review of Patient Reported Information or Presenting Complaint? No  Method: No data recorded Availability of Means: No data recorded Intent: No data recorded Notification Required: No data recorded Additional Information for Danger to Others Potential: No data recorded Additional Comments for Danger to Others Potential: No data recorded Are There Guns or Other Weapons in Your Home? No data recorded Types of Guns/Weapons: No data recorded Are These Weapons Safely Secured?                            No data recorded Who Could Verify You Are Able To Have These Secured: No data recorded Do You Have any Outstanding Charges, Pending Court Dates, Parole/Probation? No data recorded Contacted To Inform of Risk of Harm To Self or Others: No data recorded  Location of Assessment: [facility] ED   Does Patient Present under Involuntary Commitment? Yes  IVC Papers Initial File Date: 07/15/2020   [place] of Residence: [place]   Patient Currently Receiving the Following Services: No data recorded  Determination of Need: Emergent (2 hours)   Options For Referral: No data recorded    CCA Biopsychosocial Intake/Chief Complaint:  Patient has been IVC'd by father due to Methamphetamine use and threatening mother  Current Symptoms/Problems: Patient has been IVC'd by father due to Methamphetamine use and threatening mother   Patient Reported Schizophrenia/Schizoaffective Diagnosis in Past: No   Strengths: Unknown  Preferences: Unknown  Abilities: Unknown   Type of Services Patient Feels are Needed: Unknown   Initial Clinical Notes/Concerns: None   Mental Health Symptoms Depression:  None   Duration of Depressive symptoms: No data recorded  Mania:  None   Anxiety:   None   Psychosis:  None   Duration of Psychotic symptoms: No data recorded  Trauma:  None   Obsessions:  None    Compulsions:  None   Inattention:  None   Hyperactivity/Impulsivity:  N/A   Oppositional/Defiant Behaviors:  None   Emotional Irregularity:  None   Other Mood/Personality Symptoms:  No data recorded   Mental Status Exam Appearance and self-care  Stature:  Average   Weight:  Average weight   Clothing:  Casual   Grooming:  Normal   Cosmetic use:  None   Posture/gait:  Normal   Motor activity:  Not Remarkable   Sensorium  Attention:  Normal   Concentration:  Normal   Orientation:  X5   Recall/memory:  Normal   Affect and Mood  Affect:  Appropriate   Mood:  Other (Comment)   Relating  Eye contact:  Normal   Facial expression:  Responsive   Attitude toward examiner:  Cooperative   Thought and Language  Speech flow: Normal   Thought content:  Appropriate to Mood and Circumstances   Preoccupation:  None   Hallucinations:  None   Organization:  No data recorded  [employer] of Knowledge:  Fair   Intelligence:  Average   Abstraction:  Normal   Judgement:  Fair   Dance movement psychotherapist:  Realistic   Insight:  Fair   Decision Making:  Impulsive   Social Functioning  Social Maturity:  Impulsive   Social Judgement:  Heedless   Stress  Stressors:  Family conflict   Coping Ability:  Exhausted   Skill Deficits:  None   Supports:  Family     Religion: Religion/Spirituality Are You A Religious Person?: No  Leisure/Recreation: Leisure / Recreation Do You Have Hobbies?: No  Exercise/Diet: Exercise/Diet Do You Exercise?: No Have You Gained or Lost A Significant Amount of Weight in the Past Six Months?: No Do You Follow a Special Diet?: No Do You Have Any Trouble Sleeping?: No   CCA Employment/Education Employment/Work Situation: Employment / Work Situation Employment situation: Biomedical scientist job has been impacted by current illness: No What is the longest time patient has a held a job?: Unknown Has patient ever  been in the [employer]?: No  Education: Education Is Patient Currently Attending School?: No Did Garment/textile technologist From [employer]?:  (Unknown)   CCA Family/Childhood History Family and Relationship History: Family history Marital status: Single Are you sexually active?:  (Unknown) What is your sexual orientation?: Unknown Has your sexual activity been affected by drugs, alcohol, medication, or emotional stress?: Unknown Does patient have children?:  (Unknown)  Childhood History:  Childhood History By whom was/is the patient raised?: Both parents Additional childhood history information: Unknown Description of patient's relationship with caregiver when they were a child: Unknown Patient's description of current relationship with people who raised him/her: Unknown How were you disciplined when you got in trouble as a child/adolescent?: Unknown Does patient have siblings?:  (Unknown) Did patient suffer any verbal/emotional/physical/sexual abuse as a child?: No Did patient suffer from severe childhood neglect?: No Has patient ever been sexually abused/assaulted/raped as an adolescent or adult?: No Was the patient ever a victim of a crime or a disaster?: No Witnessed domestic violence?: No Has patient been affected by domestic violence as an adult?: No  Child/Adolescent Assessment:     CCA Substance Use Alcohol/Drug Use: Alcohol / Drug Use Pain Medications: See MAR Prescriptions: See MAR Over the Counter: See MAR History of alcohol / drug use?: Yes Substance #1 Name of Substance 1: Methamphetamine                       ASAM's:  Six Dimensions of Multidimensional Assessment  Dimension 1:  Acute Intoxication and/or Withdrawal Potential:      Dimension 2:  Biomedical Conditions and Complications:      Dimension 3:  Emotional, Behavioral, or Cognitive Conditions and Complications:     Dimension 4:  Readiness to Change:     Dimension 5:  Relapse, Continued use, or  Continued Problem Potential:     Dimension 6:  Recovery/Living Environment:     ASAM Severity Score:    ASAM Recommended Level of Treatment:     Substance use Disorder (SUD)    Recommendations for Services/Supports/Treatments:   Per Psyc NP Rashaun Dixon patient will be observed overnight and reassessed   DSM5 Diagnoses: Patient Active Problem List   Diagnosis Date Noted  . C2 cervical fracture (HCC) 12/02/2018  . Abnormal liver function 12/02/2018  . Hypokalemia 12/02/2018  . Abrasions of multiple sites     Patient Centered Plan: Patient is on the following Treatment Plan(s):  Substance Abuse   Referrals to Alternative Service(s): Referred to Alternative Service(s):   Place:   Date:   Time:    Referred to Alternative Service(s):   Place:   Date:   Time:  Referred to Alternative Service(s):   Place:   Date:   Time:    Referred to Alternative Service(s):   Place:   Date:   Time:     Maxim Bedel A Isabellamarie Randa, LCAS-A

## 2020-07-15 NOTE — ED Notes (Signed)
Pt did not verbally respond to this Clinical research associate.  When asked if he was feeling okay, pt nodded yes.

## 2020-07-15 NOTE — ED Notes (Signed)
Dr.Clapacs at bedside  

## 2020-07-15 NOTE — Consult Note (Signed)
[facility] Face-to-Face Psychiatry Consult   Reason for Consult:  Psych evaluation  Referring Physician:  Dr. Scotty Court Patient Identification: Adam Donovan MRN:  [id number] Principal Diagnosis: <principal problem not specified> Diagnosis:  Active Problems:   Polysubstance abuse (HCC)   Aggressive behavior   Total Time spent with patient: 1 hour  Subjective:   Adam Donovan is a 40 y.o. male patient admitted with "Im here because my dad lied on me."  Per triage nurse, pt to ED under IVC that father took out reports pt is using methamphetamine and has become violent threatening his mother. Pt confirms that he does use drugs and alcohol but did not threaten his mother and does not have Homicidal ideations. Pt also agrees that he slept outside last night but because he had nowhere else to go and denies feeling paranoid or like anyone is following him. Pt is calm in triage. Last use of drugs was approximately a week ago and last beer was yesterday. Difficulty sleeping also reported.   HPI:  Tele Assessment  Adam Donovan, 40 y.o., male patient presented to [facility].  Patient seen face to face by TTS and this provider; chart reviewed and consulted with Dr. Lucianne Muss on 07/15/20.  On evaluation Adam Donovan reports that he is here because his dad lied and stated that he was aggressive towards his mother.  He admits to drug use a few days ago (meth and cocaine) and states that he is not interested in detox or rehab at this time.   During TTS assessment and per TTS, patient appears alert and oriented x4, calm and cooperative, mood is pleasant. Patient reports "I got into a argument with my mother and now my father is lying on me." Patient currently denies threatening his mother. Patient denies using any substances tonight. Patient UDS is positive for Amphetamines and Cocaine. When asked if patient is willing to get help for his substance abuse patient denies wanting help. Patient denies SI/HI/AH/VH and  does not appear to be responding to any internal or external stimuli  During evaluation Adam Donovan is laying inbed; he is alert/oriented x 4; calm/cooperative; and mood congruent with affect.  Patient is speaking in a clear tone at low volume, and slowed  pace; with fair eye contact.  His thought process is coherent and relevant; There is no indication that he is currently responding to internal/external stimuli or experiencing delusional thought content.  Patient denies suicidal/self-harm/homicidal ideation, psychosis, and paranoia.  Patient has remained calm throughout assessment and has answered questions appropriately.   After thorough evaluation and review of information currently presented on assessment of Adam Donovan, there is insufficient findings to indicate patient meets criteria for involuntary commitment or require an inpatient level of care  A detailed risk assessment has been completed based on clinical exam and individual risk factors.  Patient acute suicide risk is low;At this time patient is educated and verbalizes understanding of mental health resources and other crisis services in the community. They are instructed to call 911 and present to the nearest emergency room should they experience any suicidal/homicidal ideation, auditory/visual/hallucinations, or detrimental worsening of their mental health condition. Writer also advised the patient to call the toll free phone on insurance card to assist with identifying in network counselors and agencies  Recommendations:  Patient can be discharged in the am if he remains psychiatrically stable after reassessment.     Past Psychiatric History: Polysubstance abuse   Risk to Self:   Risk  to Others:   Prior Inpatient Therapy:   Prior Outpatient Therapy:    Past Medical History:  Past Medical History:  Diagnosis Date  . Alcohol abuse    alcohol withdrawal  . IV drug abuse [facility])     Past Surgical History:  Procedure  Laterality Date  . APPENDECTOMY     Family History:  Family History  Family history unknown: Yes   Family Psychiatric  History: unknown Social History:  Social History   Substance and Sexual Activity  Alcohol Use Not Currently   Comment: every other day     Social History   Substance and Sexual Activity  Drug Use Not Currently  . Types: Cocaine, IV   Comment: saboxone    Social History   Socioeconomic History  . Marital status: Married    Spouse name: Not on file  . Number of children: Not on file  . Years of education: Not on file  . Highest education level: Not on file  Occupational History  . Not on file  Tobacco Use  . Smoking status: Former Smoker    Packs/day: 1.00    Types: Cigarettes  . Smokeless tobacco: Former Neurosurgeon    Types: Engineer, drilling  . Vaping Use: Some days  Substance and Sexual Activity  . Alcohol use: Not Currently    Comment: every other day  . Drug use: Not Currently    Types: Cocaine, IV    Comment: saboxone  . Sexual activity: Not on file  Other Topics Concern  . Not on file  Social History Narrative   ** Merged History Encounter **       Social Determinants of Health   Financial Resource Strain: Not on file  Food Insecurity: Not on file  Transportation Needs: Not on file  Physical Activity: Not on file  Stress: Not on file  Social Connections: Not on file   Additional Social History:    Allergies:  No Known Allergies  Labs:  Results for orders placed or performed during the hospital encounter of 07/14/20 (from the past 48 hour(s))  Comprehensive metabolic panel     Status: Abnormal   Collection Time: 07/14/20  9:15 PM  Result Value Ref Range   Sodium 137 135 - 145 mmol/L   Potassium 4.6 3.5 - 5.1 mmol/L   Chloride 104 98 - 111 mmol/L   CO2 25 22 - 32 mmol/L   Glucose, Bld 110 (H) 70 - 99 mg/dL    Comment: Glucose reference range applies only to samples taken after fasting for at least 8 hours.   BUN 15 6 - 20  mg/dL   Creatinine, Ser 1.19 0.61 - 1.24 mg/dL   Calcium 8.9 8.9 - 41.7 mg/dL   Total Protein 7.4 6.5 - 8.1 g/dL   Albumin 3.8 3.5 - 5.0 g/dL   AST 53 (H) 15 - 41 U/L   ALT 56 (H) 0 - 44 U/L   Alkaline Phosphatase 85 38 - 126 U/L   Total Bilirubin 0.5 0.3 - 1.2 mg/dL   GFR, Estimated >40 >81 mL/min    Comment: (NOTE) Calculated using the CKD-EPI Creatinine Equation (2021)    Anion gap 8 5 - 15    Comment: Performed at [facility], [address] [place], [place] [address]  Ethanol     Status: None   Collection Time: 07/14/20  9:15 PM  Result Value Ref Range   Alcohol, Ethyl (B) <10 <10 mg/dL    Comment: (NOTE)  Lowest detectable limit for serum alcohol is 10 mg/dL.  For medical purposes only. Performed at [facility], [address] [place], [place] [address]   cbc     Status: None   Collection Time: 07/14/20  9:15 PM  Result Value Ref Range   WBC 4.1 4.0 - 10.5 K/uL   RBC 5.10 4.22 - 5.81 MIL/uL   Hemoglobin 15.0 13.0 - 17.0 g/dL   HCT 25.3 66.4 - 40.3 %   MCV 86.9 80.0 - 100.0 fL   MCH 29.4 26.0 - 34.0 pg   MCHC 33.9 30.0 - 36.0 g/dL   RDW 47.4 25.9 - 56.3 %   Platelets 241 150 - 400 K/uL   nRBC 0.0 0.0 - 0.2 %    Comment: Performed at [facility], [address] [place], [place] [address]  Urine Drug Screen, Qualitative     Status: Abnormal   Collection Time: 07/14/20  9:15 PM  Result Value Ref Range   Tricyclic, Ur Screen NONE DETECTED NONE DETECTED   Amphetamines, Ur Screen POSITIVE (A) NONE DETECTED   MDMA (Ecstasy)Ur Screen NONE DETECTED NONE DETECTED   Cocaine Metabolite,Ur [place] POSITIVE (A) NONE DETECTED   Opiate, Ur Screen NONE DETECTED NONE DETECTED   Phencyclidine (PCP) Ur S NONE DETECTED NONE DETECTED   Cannabinoid 50 Ng, Ur [place] NONE DETECTED NONE DETECTED   Barbiturates, Ur Screen NONE DETECTED NONE DETECTED   Benzodiazepine, Ur Scrn NONE DETECTED NONE DETECTED   Methadone Scn, Ur NONE DETECTED NONE DETECTED     Comment: (NOTE) Tricyclics + metabolites, urine    Cutoff 1000 ng/mL Amphetamines + metabolites, urine  Cutoff 1000 ng/mL MDMA (Ecstasy), urine              Cutoff 500 ng/mL Cocaine Metabolite, urine          Cutoff 300 ng/mL Opiate + metabolites, urine        Cutoff 300 ng/mL Phencyclidine (PCP), urine         Cutoff 25 ng/mL Cannabinoid, urine                 Cutoff 50 ng/mL Barbiturates + metabolites, urine  Cutoff 200 ng/mL Benzodiazepine, urine              Cutoff 200 ng/mL Methadone, urine                   Cutoff 300 ng/mL  The urine drug screen provides only a preliminary, unconfirmed analytical test result and should not be used for non-medical purposes. Clinical consideration and professional judgment should be applied to any positive drug screen result due to possible interfering substances. A more specific alternate chemical method must be used in order to obtain a confirmed analytical result. Gas chromatography / mass spectrometry (GC/MS) is the preferred confirm atory method. Performed at [facility], [address] [place], [place] [address]     No current facility-administered medications for this encounter.   Current Outpatient Medications  Medication Sig Dispense Refill  . bacitracin ointment Apply topically 2 (two) times daily. 120 g 0  . carbamazepine (TEGRETOL) 200 MG tablet 800mg  PO QD X 1D, then 600mg  PO QD X 1D, then 400mg  QD X 1D, then 200mg  PO QD X 2D 11 tablet 0  . hydrOXYzine (ATARAX/VISTARIL) 25 MG tablet Take 1 tablet (25 mg total) by mouth every 6 (six) hours as needed for itching. 12 tablet 0    Musculoskeletal: Strength & Muscle Tone: within normal  limits Gait & Station: normal Patient leans: N/A  Psychiatric Specialty Exam: Physical Exam Vitals and nursing note reviewed.  HENT:     Head: Normocephalic and atraumatic.     Nose: Nose normal.  Eyes:     Pupils: Pupils are equal, round, and reactive to light.  Pulmonary:      Effort: Pulmonary effort is normal.  Musculoskeletal:        General: Normal range of motion.     Cervical back: Normal range of motion.  Neurological:     General: No focal deficit present.     Mental Status: He is alert and oriented to person, place, and time.  Psychiatric:        Attention and Perception: Attention normal.        Mood and Affect: Mood normal.        Speech: Speech normal.        Behavior: Behavior normal. Behavior is cooperative.        Thought Content: Thought content normal.        Cognition and Memory: Cognition and memory normal.        Judgment: Judgment normal.     Review of Systems  Psychiatric/Behavioral: Negative for confusion, hallucinations and suicidal ideas.  All other systems reviewed and are negative.   Blood pressure 114/85, pulse 87, temperature 97.7 F (36.5 C), temperature source Oral, resp. rate 16, height 5\' 11"  (1.803 m), weight 83.9 kg, SpO2 98 %.Body mass index is 25.8 kg/m.  General Appearance: Disheveled  Eye Contact:  Fair  Speech:  Clear and Coherent  Volume:  Decreased  Mood:  NA  Affect:  Congruent  Thought Process:  Coherent and Descriptions of Associations: Intact  Orientation:  Full (Time, Place, and Person)  Thought Content:  WDL and Logical  Suicidal Thoughts:  No  Homicidal Thoughts:  No  Memory:  Recent;   Fair  Judgement:  Fair  Insight:  Fair  Psychomotor Activity:  Normal  Concentration:  Concentration: Fair  Recall:  [employer] of Knowledge:  Fair  Language:  Good  Akathisia:  NA  Handed:  Right  AIMS (if indicated):     Assets:  Housing  ADL's:  Intact  Cognition:  WNL  Sleep:        Treatment Plan Summary: Discharge  Disposition: No evidence of imminent risk to self or others at present.   Patient does not meet criteria for psychiatric inpatient admission. Discussed crisis plan, support from social network, calling 911, coming to the Emergency Department, and calling Suicide Hotline.  Jearld Lesch, NP 07/15/2020 4:09 AM

## 2020-07-15 NOTE — ED Notes (Signed)
Pt discharged home.  VS stable.  All belongings returned to patient.  Pt denies SI/HI.  

## 2020-07-15 NOTE — ED Notes (Signed)
VOLUNTARY as IVC was rescinded by Dr Toni Amend and will discharge

## 2020-07-15 NOTE — ED Notes (Signed)
[facility] is here to transport pt home.

## 2020-07-15 NOTE — ED Notes (Signed)
Pt sleeping. VS taken once awake.

## 2020-07-15 NOTE — BH Assessment (Signed)
Comprehensive Clinical Assessment (CCA) Note  07/15/2020 Adam Donovan [id number]  Chief Complaint:  Chief Complaint  Patient presents with  . IVC   Visit Diagnosis: Substance Induced Mood Disorder    CCA Screening, Triage and Referral (STR)  Patient Reported Information How did you hear about [place]? Family/Friend  Referral name: Father  Referral phone number: No data recorded  Whom do you see for routine medical problems? I don't have a doctor  Practice/Facility Name: No data recorded Practice/Facility Phone Number: No data recorded Name of Contact: No data recorded Contact Number: No data recorded Contact Fax Number: No data recorded Prescriber Name: No data recorded Prescriber Address (if known): No data recorded  What Is the Reason for Your Visit/Call Today? Father was concerned about  How Long Has This Been Causing You Problems? <Week  What Do You Feel Would Help You the Most Today? Other (Comment)   Have You Recently Been in Any Inpatient Treatment (Hospital/Detox/Crisis Center/28-Day Program)? No  Name/Location of Program/Hospital:No data recorded How Long Were You There? No data recorded When Were You Discharged? No data recorded  Have You Ever Received Services From [facility] Before? No  Who Do You See at [facility]? No data recorded  Have You Recently Had Any Thoughts About Hurting Yourself? No  Are You Planning to Commit Suicide/Harm Yourself At This time? No   Have you Recently Had Thoughts About Hurting Someone Karolee Ohs? No  Explanation: No data recorded  Have You Used Any Alcohol or Drugs in the Past 24 Hours? Yes  How Long Ago Did You Use Drugs or Alcohol? 1200  What Did You Use and How Much? Alcohol   Do You Currently Have a Therapist/Psychiatrist? No  Name of Therapist/Psychiatrist: No data recorded  Have You Been Recently Discharged From Any Office Practice or Programs? No  Explanation of Discharge From Practice/Program: No data  recorded    CCA Screening Triage Referral Assessment Type of Contact: Face-to-Face  Is this Initial or Reassessment? No data recorded Date Telepsych consult ordered in [facility]:  No data recorded Time Telepsych consult ordered in [facility]:  No data recorded  Patient Reported Information Reviewed? No  Patient Left Without Being Seen? No  Reason for Not Completing Assessment: No data recorded  Collateral Involvement: None   Does Patient Have a Court Appointed Legal Guardian? No data recorded Name and Contact of Legal Guardian: No data recorded If Minor and Not Living with Parent(s), Who has Custody? n/a  Is CPS involved or ever been involved? Never  Is APS involved or ever been involved? Never   Patient Determined To Be At Risk for Harm To Self or Others Based on Review of Patient Reported Information or Presenting Complaint? No  Method: No data recorded Availability of Means: No data recorded Intent: No data recorded Notification Required: No data recorded Additional Information for Danger to Others Potential: No data recorded Additional Comments for Danger to Others Potential: No data recorded Are There Guns or Other Weapons in Your Home? No data recorded Types of Guns/Weapons: No data recorded Are These Weapons Safely Secured?                            No data recorded Who Could Verify You Are Able To Have These Secured: No data recorded Do You Have any Outstanding Charges, Pending Court Dates, Parole/Probation? No data recorded Contacted To Inform of Risk of Harm To Self or Others: No data recorded  Location of Assessment: [facility] ED   Does Patient Present under Involuntary Commitment? Yes  IVC Papers Initial File Date: 07/14/2020   [place] of Residence: [place]   Patient Currently Receiving the Following Services: Not Receiving Services   Determination of Need: Urgent (48 hours)   Options For Referral: ED Visit     CCA Biopsychosocial Intake/Chief Complaint:   Patient has been IVC'd by father due to Methamphetamine use and threatening mother  Current Symptoms/Problems: Patient has been IVC'd by father due to Methamphetamine use and threatening mother   Patient Reported Schizophrenia/Schizoaffective Diagnosis in Past: No   Strengths: Unknown  Preferences: Unknown  Abilities: Unknown   Type of Services Patient Feels are Needed: Unknown   Initial Clinical Notes/Concerns: None   Mental Health Symptoms Depression:  None   Duration of Depressive symptoms: No data recorded  Mania:  None   Anxiety:   None   Psychosis:  None   Duration of Psychotic symptoms: No data recorded  Trauma:  None   Obsessions:  None   Compulsions:  None   Inattention:  None   Hyperactivity/Impulsivity:  N/A   Oppositional/Defiant Behaviors:  None   Emotional Irregularity:  None   Other Mood/Personality Symptoms:  No data recorded   Mental Status Exam Appearance and self-care  Stature:  Average   Weight:  Average weight   Clothing:  Casual   Grooming:  Normal   Cosmetic use:  None   Posture/gait:  Normal   Motor activity:  Not Remarkable   Sensorium  Attention:  Normal   Concentration:  Normal   Orientation:  X5   Recall/memory:  Normal   Affect and Mood  Affect:  Appropriate   Mood:  Other (Comment)   Relating  Eye contact:  Normal   Facial expression:  Responsive   Attitude toward examiner:  Cooperative   Thought and Language  Speech flow: Normal   Thought content:  Appropriate to Mood and Circumstances   Preoccupation:  None   Hallucinations:  None   Organization:  No data recorded  [employer] of Knowledge:  Fair   Intelligence:  Average   Abstraction:  Normal   Judgement:  Fair   Brewing technologist   Insight:  Fair   Decision Making:  Impulsive   Social Functioning  Social Maturity:  Impulsive   Social Judgement:  Heedless   Stress  Stressors:  Family conflict    Coping Ability:  Exhausted   Skill Deficits:  None   Supports:  Family     Religion: Religion/Spirituality Are You A Religious Person?: No  Leisure/Recreation: Leisure / Recreation Do You Have Hobbies?: No  Exercise/Diet: Exercise/Diet Do You Exercise?: No Have You Gained or Lost A Significant Amount of Weight in the Past Six Months?: No Do You Follow a Special Diet?: No Do You Have Any Trouble Sleeping?: No   CCA Employment/Education Employment/Work Situation: Employment / Work Situation Employment situation: Unemployed Patient's job has been impacted by current illness: No What is the longest time patient has a held a job?: Unknown Has patient ever been in the [employer]?: No  Education: Education Is Patient Currently Attending School?: No Did Garment/textile technologist From [employer]?:  (Unknown)   CCA Family/Childhood History Family and Relationship History: Family history Marital status: Single Are you sexually active?:  (Unknown) What is your sexual orientation?: Unknown Has your sexual activity been affected by drugs, alcohol, medication, or emotional stress?: Unknown Does patient have children?:  (Unknown)  Childhood History:  Childhood History By whom was/is the patient raised?: Both parents Additional childhood history information: Unknown Description of patient's relationship with caregiver when they were a child: Unknown Patient's description of current relationship with people who raised him/her: Unknown How were you disciplined when you got in trouble as a child/adolescent?: Unknown Does patient have siblings?:  (Unknown) Did patient suffer any verbal/emotional/physical/sexual abuse as a child?: No Did patient suffer from severe childhood neglect?: No Has patient ever been sexually abused/assaulted/raped as an adolescent or adult?: No Was the patient ever a victim of a crime or a disaster?: No Witnessed domestic violence?: No Has patient been affected by  domestic violence as an adult?: No  Child/Adolescent Assessment:     CCA Substance Use Alcohol/Drug Use: Alcohol / Drug Use Pain Medications: See MAR Prescriptions: See MAR Over the Counter: See MAR History of alcohol / drug use?: Yes Substance #1 Name of Substance 1: Methamphetamine                       ASAM's:  Six Dimensions of Multidimensional Assessment  Dimension 1:  Acute Intoxication and/or Withdrawal Potential:      Dimension 2:  Biomedical Conditions and Complications:      Dimension 3:  Emotional, Behavioral, or Cognitive Conditions and Complications:     Dimension 4:  Readiness to Change:     Dimension 5:  Relapse, Continued use, or Continued Problem Potential:     Dimension 6:  Recovery/Living Environment:     ASAM Severity Score:    ASAM Recommended Level of Treatment:     Substance use Disorder (SUD)    Recommendations for Services/Supports/Treatments:    DSM5 Diagnoses: Patient Active Problem List   Diagnosis Date Noted  . Polysubstance abuse (HCC) 07/15/2020  . Aggressive behavior 07/15/2020  . Substance induced mood disorder (HCC) 07/15/2020  . 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 Cocaine abuse (HCC) 07/15/2020  . C2 cervical fracture (HCC) 12/02/2018  . Abnormal liver function 12/02/2018  . Hypokalemia 12/02/2018  . Abrasions of multiple sites     Patient Centered Plan: Patient is on the following Treatment Plan(s):  Substance Abuse   Referrals to Alternative Service(s): Referred to Alternative Service(s):   Place:   Date:   Time:    Referred to Alternative Service(s):   Place:   Date:   Time:    Referred to Alternative Service(s):   Place:   Date:   Time:    Referred to Alternative Service(s):   Place:   Date:   Time:     Lilyan Gilford MS, LCAS, [facility], [facility] Therapeutic Triage Specialist 07/15/2020 1:34 PM

## 2020-07-15 NOTE — ED Notes (Signed)
RN called non emergency [employer] number to get patient a ride home.

## 2020-09-21 ENCOUNTER — Emergency Department
Admission: EM | Admit: 2020-09-21 | Discharge: 2020-09-22 | Discharge status: Against Medical Advice | Payer: Self-pay | Attending: Emergency Medicine | Admitting: Emergency Medicine

## 2020-09-21 ENCOUNTER — Other Ambulatory Visit: Payer: Self-pay

## 2020-09-21 DIAGNOSIS — R0602 Shortness of breath: Secondary | ICD-10-CM | POA: Insufficient documentation

## 2020-09-21 DIAGNOSIS — R42 Dizziness and giddiness: Secondary | ICD-10-CM | POA: Insufficient documentation

## 2020-09-21 DIAGNOSIS — R079 Chest pain, unspecified: Secondary | ICD-10-CM

## 2020-09-21 DIAGNOSIS — F17210 Nicotine dependence, cigarettes, uncomplicated: Secondary | ICD-10-CM | POA: Insufficient documentation

## 2020-09-21 DIAGNOSIS — R0789 Other chest pain: Secondary | ICD-10-CM | POA: Insufficient documentation

## 2020-09-21 NOTE — ED Notes (Signed)
Pt refused blood draw at this time.

## 2020-09-21 NOTE — ED Triage Notes (Signed)
Pt presents via EMS for c/o SOB x a few minutes. Denies cough, fever. Pt reports episodes of chest pain, denies pain at this time.  Denies leg pain. Pt sts SOB started while walking.   Pt appears agitated, sts he is angry about something that happened earlier. Pt cooperative with triage assessment.

## 2020-09-22 ENCOUNTER — Other Ambulatory Visit: Payer: Self-pay

## 2020-09-22 ENCOUNTER — Emergency Department: Payer: Self-pay

## 2020-09-22 ENCOUNTER — Emergency Department
Admission: EM | Admit: 2020-09-22 | Discharge: 2020-09-22 | Disposition: A | Discharge status: Home / Self Care | Payer: Self-pay | Attending: Emergency Medicine | Admitting: Emergency Medicine

## 2020-09-22 DIAGNOSIS — R0602 Shortness of breath: Secondary | ICD-10-CM | POA: Insufficient documentation

## 2020-09-22 DIAGNOSIS — F1910 Other psychoactive substance abuse, uncomplicated: Secondary | ICD-10-CM | POA: Insufficient documentation

## 2020-09-22 DIAGNOSIS — F17210 Nicotine dependence, cigarettes, uncomplicated: Secondary | ICD-10-CM | POA: Insufficient documentation

## 2020-09-22 DIAGNOSIS — R0789 Other chest pain: Secondary | ICD-10-CM | POA: Insufficient documentation

## 2020-09-22 DIAGNOSIS — R079 Chest pain, unspecified: Secondary | ICD-10-CM

## 2020-09-22 LAB — CBC
HCT: 38.7 % — ABNORMAL LOW (ref 39.0–52.0)
Hemoglobin: 13.3 g/dL (ref 13.0–17.0)
MCH: 29.8 pg (ref 26.0–34.0)
MCHC: 34.4 g/dL (ref 30.0–36.0)
MCV: 86.8 fL (ref 80.0–100.0)
Platelets: 307 10*3/uL (ref 150–400)
RBC: 4.46 MIL/uL (ref 4.22–5.81)
RDW: 13.2 % (ref 11.5–15.5)
WBC: 7.9 10*3/uL (ref 4.0–10.5)
nRBC: 0 % (ref 0.0–0.2)

## 2020-09-22 LAB — BASIC METABOLIC PANEL
Anion gap: 8 (ref 5–15)
BUN: 17 mg/dL (ref 6–20)
CO2: 25 mmol/L (ref 22–32)
Calcium: 8.8 mg/dL — ABNORMAL LOW (ref 8.9–10.3)
Chloride: 105 mmol/L (ref 98–111)
Creatinine, Ser: 0.82 mg/dL (ref 0.61–1.24)
GFR, Estimated: >60 mL/min (ref >60)
Glucose, Bld: 135 mg/dL — ABNORMAL HIGH (ref 70–99)
Potassium: 3.7 mmol/L (ref 3.5–5.1)
Sodium: 138 mmol/L (ref 135–145)

## 2020-09-22 LAB — TROPONIN I (HIGH SENSITIVITY)
Troponin I (High Sensitivity): 3 ng/L (ref <18)
Troponin I (High Sensitivity): 3 ng/L (ref <18)

## 2020-09-22 IMAGING — DX DG CHEST 1V PORT
1 series · 1 of 1 positions shown · non-contrast
Comparison: [DATE]

CLINICAL DATA: Chest pain

EXAM:
PORTABLE CHEST 1 VIEW

[chest ap]
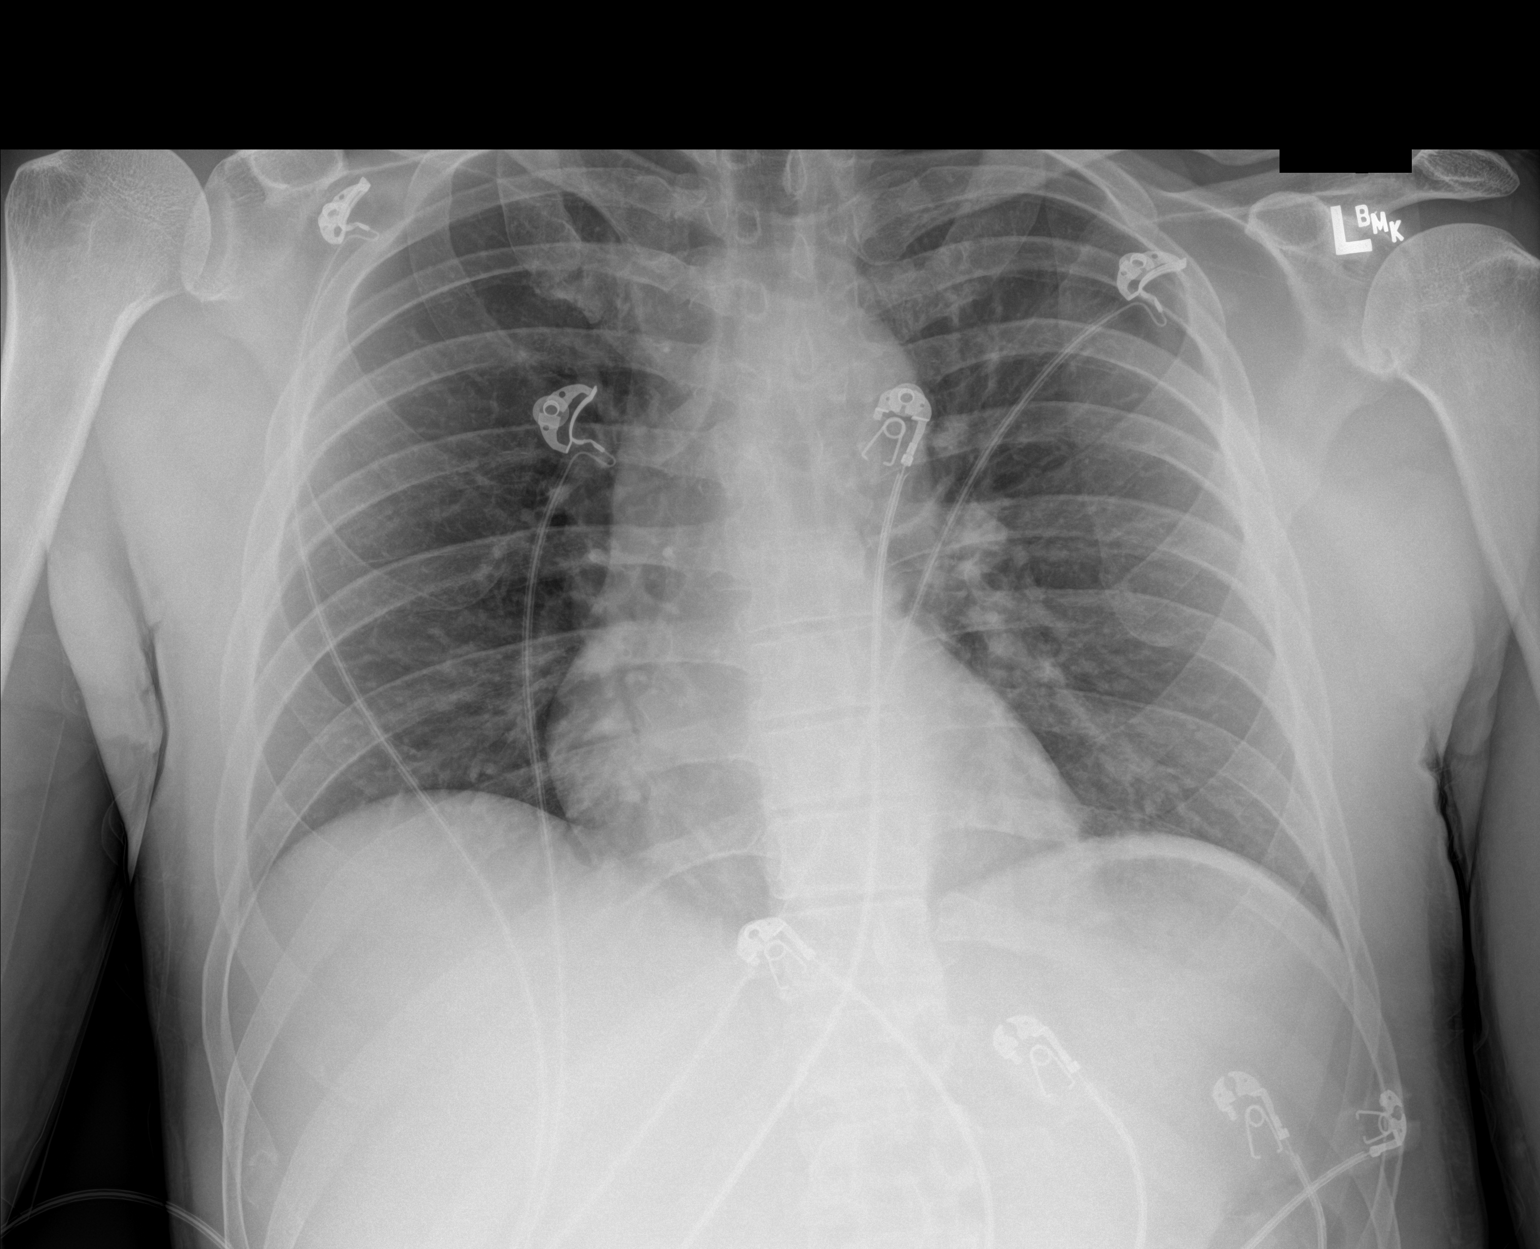

[1 of 1 positions shown; findings below may reference images not displayed]

FINDINGS: Normal heart size and mediastinal contours. No acute infiltrate or
edema. No effusion or pneumothorax. No acute osseous findings.
IMPRESSION: Negative chest.

## 2020-09-22 MED ORDER — LORAZEPAM 1 MG PO TABS
1.0000 mg | ORAL_TABLET | Freq: Once | ORAL | Status: AC
  Administered 2020-09-22: 1 mg via ORAL
  Filled 2020-09-22: qty 1

## 2020-09-22 MED ORDER — ASPIRIN 81 MG PO CHEW
324.0000 mg | CHEWABLE_TABLET | Freq: Once | ORAL | Status: AC
  Administered 2020-09-22: 324 mg via ORAL
  Filled 2020-09-22: qty 4

## 2020-09-22 NOTE — ED Notes (Signed)
Per Engineer, materials, pt left EC via ambulation. Dr Don Perking aware. No IV was placed.

## 2020-09-22 NOTE — ED Notes (Signed)
Pt resting at this time, RR even and unlabored.

## 2020-09-22 NOTE — ED Provider Notes (Signed)
Assumed care from Dr. Don Perking at 7 AM. Briefly, the patient is a 40 y.o. male with PMHx of  has a past medical history of Alcohol abuse and IV drug abuse (HCC). here with chest pain, likely related to meth/stimulant use. EKG non ischemic. Initial trop negative. Labs otherwise unremarkable. Plan to f/u second trop, d/c if negative.   Labs Reviewed  CBC - Abnormal; Notable for the following components:      Result Value   HCT 38.7 (*)    All other components within normal limits  BASIC METABOLIC PANEL - Abnormal; Notable for the following components:   Glucose, Bld 135 (*)    Calcium 8.8 (*)    All other components within normal limits  TROPONIN I (HIGH SENSITIVITY)  TROPONIN I (HIGH SENSITIVITY)    Course of Care: Patient now awake, alert, in NAD. D/c home. Tolerated PO. Ambulatory.     Shaune Pollack, MD 09/22/20 1331

## 2020-09-22 NOTE — ED Notes (Signed)
Pt provided phone, d/c instructions

## 2020-09-22 NOTE — ED Provider Notes (Signed)
[facility] Emergency Department Provider Note  ____________________________________________  Time seen: Approximately 12:17 AM  I have reviewed the triage vital signs and the nursing notes.   HISTORY  Chief Complaint Shortness of Breath   HPI Adam Donovan is a 40 y.o. male with a history of polysubstance abuse, alcohol abuse, aggressive behavior who presents via EMS.  Patient reports that he was walking when he got approached by police.   Patient reports that he felt nervous and briefly felt dizzy, had some chest pressure and shortness of breath.  He told that to the cops were called 911 instead of arresting him and patient was brought to the ED for evaluation.  Upon arrival patient reports that his symptoms are fully resolved.  But he feels that he was like that because the cops were " trying to do something to him."  He denies any history of lung problems, heart disease, PE or DVT, recent travel immobilization, leg pain or swelling, hemoptysis or exogenous hormones.  He denies any active chest pain or shortness of breath.  Denies any drugs or alcohol tonight.  He denies suicidal thoughts or homicidal thoughts.  Past Medical History:  Diagnosis Date  . Alcohol abuse    alcohol withdrawal  . IV drug abuse [facility])     Patient Active Problem List   Diagnosis Date Noted  . Polysubstance abuse (HCC) 07/15/2020  . Aggressive behavior 07/15/2020  . Substance induced mood disorder (HCC) 07/15/2020  . 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 Cocaine abuse (HCC) 07/15/2020  . C2 cervical fracture (HCC) 12/02/2018  . Abnormal liver function 12/02/2018  . Hypokalemia 12/02/2018  . Abrasions of multiple sites     Past Surgical History:  Procedure Laterality Date  . APPENDECTOMY      Prior to Admission medications   Medication Sig Start Date End Date Taking? Authorizing Provider  bacitracin ointment Apply topically 2 (two) times daily. 12/03/18   Burnadette Pop, MD  carbamazepine (TEGRETOL) 200 MG tablet 800mg  PO QD X 1D, then 600mg  PO QD X 1D, then 400mg  QD X 1D, then 200mg  PO QD X 2D 06/15/17   , PA-C  hydrOXYzine (ATARAX/VISTARIL) 25 MG tablet Take 1 tablet (25 mg total) by mouth every 6 (six) hours as needed for itching. 12/08/19   , PA-C    Allergies Patient has no known allergies.  Family History  Family history unknown: Yes    Social History Social History   Tobacco Use  . Smoking status: Current Every Day Smoker    Packs/day: 1.00    Types: Cigarettes  . Smokeless tobacco: Former 08/13/17    Types: Fayrene Helper  . Vaping Use: Some days  Substance Use Topics  . Alcohol use: Not Currently    Comment: every other day  . Drug use: Not Currently    Types: Cocaine, IV    Comment: saboxone    Review of Systems  Constitutional: Negative for fever. + Dizziness Eyes: Negative for visual changes. ENT: Negative for sore throat. Neck: No neck pain  Cardiovascular: + chest pain. Respiratory: + shortness of breath. Gastrointestinal: Negative for abdominal pain, vomiting or diarrhea. Genitourinary: Negative for dysuria. Musculoskeletal: Negative for back pain. Skin: Negative for rash. Neurological: Negative for headaches, weakness or numbness. Psych: No SI or HI  ____________________________________________   PHYSICAL EXAM:  VITAL SIGNS: ED Triage Vitals  Enc Vitals Group     BP 09/21/20 2341 (!) 130/98  Pulse Rate 09/21/20 2341 75     Resp 09/21/20 2341 16     Temp 09/21/20 2343 (!) 97.5 F (36.4 C)     Temp Source 09/21/20 2343 Oral     SpO2 09/21/20 2341 100 %     Weight 09/21/20 2342 200 lb (90.7 kg)     Height 09/21/20 2342 5\' 11"  (1.803 m)     Head Circumference --      Peak Flow --      Pain Score 09/21/20 2342 0     Pain Loc --      Pain Edu? --      Excl. in GC? --     Constitutional: Alert and oriented. Well appearing and in no apparent distress. HEENT:      Head:  Normocephalic and atraumatic.         Eyes: Conjunctivae are normal. Sclera is non-icteric.       Mouth/Throat: Mucous membranes are moist.       Neck: Supple with no signs of meningismus. Cardiovascular: Regular rate and rhythm. No murmurs, gallops, or rubs. 2+ symmetrical distal pulses are present in all extremities. No JVD. Respiratory: Normal respiratory effort. Lungs are clear to auscultation bilaterally.  Gastrointestinal: Soft, non tender, and non distended. Musculoskeletal:  No edema, cyanosis, or erythema of extremities. Neurologic: Normal speech and language. Face is symmetric. Moving all extremities. No gross focal neurologic deficits are appreciated. Skin: Skin is warm, dry and intact. No rash noted. Psychiatric: Mood and affect are normal. Speech and behavior are normal.  ____________________________________________   LABS (all labs ordered are listed, but only abnormal results are displayed)  Labs Reviewed  CBC  BASIC METABOLIC PANEL  URINE DRUG SCREEN, QUALITATIVE ([facility] ONLY)  ETHANOL  TROPONIN I (HIGH SENSITIVITY)   ____________________________________________  EKG  ED ECG REPORT I, 09/23/20, the attending physician, personally viewed and interpreted this ECG.  Sinus rhythm, rate of 74, normal intervals, normal axis, no ST elevations or depressions.  Normal EKG. ____________________________________________  RADIOLOGY  CXR: refused  ____________________________________________   PROCEDURES  Procedure(s) performed: None Procedures Critical Care performed:  None ____________________________________________   INITIAL IMPRESSION / ASSESSMENT AND PLAN / ED COURSE  40 y.o. male with a history of polysubstance abuse, alcohol abuse, aggressive behavior who presents via EMS.  Patient seems to have been confronted by officers and felt chest pain, dizziness and shortness of breath.  EMS brought him in for evaluation.  He reports that his symptoms were  brief while he was interacting with the cops.  He denies any symptoms at this time.  He denies any history of cardiac disease, PE or DVT, or lung disorders.  He denies any drug or alcohol tonight.  No indication for IVC.  Patient is refusing labs and imaging and is requesting to be discharged.  I was able to convince patient to stay for his evaluation however within 10 minutes patient eloped the emergency room.  Did have an EKG which showed no signs of acute ischemia but no imaging or blood work.       _____________________________________________ Please note:  Patient was evaluated in Emergency Department today for the symptoms described in the history of present illness. Patient was evaluated in the context of the global COVID-19 pandemic, which necessitated consideration that the patient might be at risk for infection with the SARS-CoV-2 virus that causes COVID-19. Institutional protocols and algorithms that pertain to the evaluation of patients at risk for COVID-19 are in a state of  rapid change based on information released by regulatory bodies including the CDC and federal and state organizations. These policies and algorithms were followed during the patient's care in the ED.  Some ED evaluations and interventions may be delayed as a result of limited staffing during the pandemic.   [place] Controlled Substance Database was reviewed by me. ____________________________________________   FINAL CLINICAL IMPRESSION(S) / ED DIAGNOSES   Final diagnoses:  Chest pain, unspecified type      NEW MEDICATIONS STARTED DURING THIS VISIT:  ED Discharge Orders    None       Note:  This document was prepared using Dragon voice recognition software and may include unintentional dictation errors.    Don Perking, [place], MD 09/22/20 [phone]

## 2020-09-22 NOTE — ED Triage Notes (Signed)
pt arrives via ems from [facility]. ems reports pt just here for CP but left prior to bloodwork. Pt called ems due to continued CP. Pt reports pain went away so he left, but then pain returned. MD present to assess  BP 153/98 100% RA

## 2020-09-22 NOTE — ED Notes (Signed)
Pt responsive to verbal stimuli, pt continues to fall back asleep very shortly after awakening. Dr Erma Heritage aware.

## 2020-09-22 NOTE — ED Provider Notes (Signed)
[facility] Emergency Department Provider Note  ____________________________________________  Time seen: Approximately 4:35 AM  I have reviewed the triage vital signs and the nursing notes.   HISTORY  Chief Complaint Chest Pain and Shortness of Breath   HPI Adam Donovan is a 40 y.o. male history of IV drug use and alcohol use who presents via EMS for chest pain.  Patient was here earlier this evening and eloped.  Reports that he used crystal meth and heroin today intravenously.  Couple of hours later started having chest pain.  Describes the pain as severe constant pressure located in the center of his chest associated with mild shortness of breath.  No pleuritic chest pain, no radiation to the back, no neurodeficits, no cough or congestion, no abdominal pain, no nausea or vomiting.  Denies any personal or family history of heart problems, lung problems, PE or DVT, recent travel immobilization, leg pain or swelling, hemoptysis, exogenous hormones.  Patient is a smoker.   Past Medical History:  Diagnosis Date  . Alcohol abuse    alcohol withdrawal  . IV drug abuse [facility])     Patient Active Problem List   Diagnosis Date Noted  . Polysubstance abuse (HCC) 07/15/2020  . Aggressive behavior 07/15/2020  . Substance induced mood disorder (HCC) 07/15/2020  . 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 Cocaine abuse (HCC) 07/15/2020  . C2 cervical fracture (HCC) 12/02/2018  . Abnormal liver function 12/02/2018  . Hypokalemia 12/02/2018  . Abrasions of multiple sites     Past Surgical History:  Procedure Laterality Date  . APPENDECTOMY      Prior to Admission medications   Medication Sig Start Date End Date Taking? Authorizing Provider  bacitracin ointment Apply topically 2 (two) times daily. 12/03/18   Burnadette Pop, MD  carbamazepine (TEGRETOL) 200 MG tablet 800mg  PO QD X 1D, then 600mg  PO QD X 1D, then 400mg  QD X 1D, then 200mg  PO QD X 2D 06/15/17   , PA-C  hydrOXYzine (ATARAX/VISTARIL) 25 MG tablet Take 1 tablet (25 mg total) by mouth every 6 (six) hours as needed for itching. 12/08/19   , PA-C    Allergies Patient has no known allergies.  Family History  Family history unknown: Yes    Social History Social History   Tobacco Use  . Smoking status: Current Every Day Smoker    Packs/day: 1.00    Types: Cigarettes  . Smokeless tobacco: Former 08/13/17    Types: Fayrene Helper  . Vaping Use: Some days  Substance Use Topics  . Alcohol use: Yes    Comment: every other day  . Drug use: Not Currently    Types: Cocaine, IV    Comment: saboxone. pt reports using heroin and "Ice" on 4/19    Review of Systems  Constitutional: Negative for fever. Eyes: Negative for visual changes. ENT: Negative for sore throat. Neck: No neck pain  Cardiovascular: + chest pain. Respiratory: + shortness of breath. Gastrointestinal: Negative for abdominal pain, vomiting or diarrhea. Genitourinary: Negative for dysuria. Musculoskeletal: Negative for back pain. Skin: Negative for rash. Neurological: Negative for headaches, weakness or numbness. Psych: No SI or HI  ____________________________________________   PHYSICAL EXAM:  VITAL SIGNS: ED Triage Vitals  Enc Vitals Group     BP 09/22/20 0432 114/78     Pulse Rate 09/22/20 0425 78     Resp 09/22/20 0425 (!) 22     Temp 09/22/20 0432 97.9 F (36.6  C)     Temp src --      SpO2 09/22/20 0425 99 %     Weight 09/22/20 0426 199 lb 15.3 oz (90.7 kg)     Height 09/22/20 0426 5\' 11"  (1.803 m)     Head Circumference --      Peak Flow --      Pain Score 09/22/20 0426 6     Pain Loc --      Pain Edu? --      Excl. in GC? --     Constitutional: Alert and oriented, no apparent distress. HEENT:      Head: Normocephalic and atraumatic.         Eyes: Conjunctivae are normal. Sclera is non-icteric.       Mouth/Throat: Mucous membranes are moist.       Neck: Supple with  no signs of meningismus. Cardiovascular: Regular rate and rhythm. No murmurs, gallops, or rubs. 2+ symmetrical distal pulses are present in all extremities. No JVD. Respiratory: Normal respiratory effort. Lungs are clear to auscultation bilaterally.  Gastrointestinal: Soft, non tender, and non distended. Musculoskeletal:  No edema, cyanosis, or erythema of extremities. Neurologic: Normal speech and language. Face is symmetric. Moving all extremities. No gross focal neurologic deficits are appreciated. Skin: Skin is warm, dry and intact. No rash noted. Psychiatric: Mood and affect are normal. Speech and behavior are normal.  ____________________________________________   LABS (all labs ordered are listed, but only abnormal results are displayed)  Labs Reviewed  CBC - Abnormal; Notable for the following components:      Result Value   HCT 38.7 (*)    All other components within normal limits  BASIC METABOLIC PANEL - Abnormal; Notable for the following components:   Glucose, Bld 135 (*)    Calcium 8.8 (*)    All other components within normal limits  TROPONIN I (HIGH SENSITIVITY)  TROPONIN I (HIGH SENSITIVITY)   ____________________________________________  EKG  ED ECG REPORT I, 09/24/20, the attending physician, personally viewed and interpreted this ECG.  Sinus rhythm, rate of 74, normal intervals, normal axis, no ST elevations or depressions.  Normal EKG. ____________________________________________  RADIOLOGY  I have personally reviewed the images performed during this visit and I agree with the Radiologist's read.   Interpretation by Radiologist:  DG Chest Portable 1 View  Result Date: 09/22/2020 CLINICAL DATA:  Chest pain EXAM: PORTABLE CHEST 1 VIEW COMPARISON:  12/02/2018 FINDINGS: Normal heart size and mediastinal contours. No acute infiltrate or edema. No effusion or pneumothorax. No acute osseous findings. IMPRESSION: Negative chest. Electronically Signed    By: 12/04/2018 M.D.   On: 09/22/2020 05:01      ____________________________________________   PROCEDURES  Procedure(s) performed:yes .1-3 Lead EKG Interpretation Performed by: 09/24/2020, MD Authorized by: Nita Sickle, MD     Interpretation: normal     ECG rate assessment: normal     Rhythm: sinus rhythm     Ectopy: none     Conduction: normal     Critical Care performed:  None ____________________________________________   INITIAL IMPRESSION / ASSESSMENT AND PLAN / ED COURSE   40 y.o. male history of IV drug use and alcohol use who presents via EMS for chest pain.  Patient endorses usage of crystal meth and heroin intravenously this evening.  EKG on arrival with no acute ischemic changes.  No asymmetric swelling or pitting edema of his extremities.  Normal pulses in all 4 extremities.  No tenderness on palpation of  his abdomen.  Lungs are clear with normal work of breathing normal sats.  Vitals are within normal limits with blood pressure 114/78 and HR 78.  Ddx drug induced vasoconstriction or bronchospasms, ACS, PTX, PNA, MSK, GERD. Aortic dissection considered, however there were with no typical symptoms of chest pain radiating through to intrascapular back, no severe hypertension, symmetric bilateral radial pulses, no associated neurologic deficits, no associated abdominal or lower extremity symptoms, no marfanoid features or evidence of underlying connective tissue disorder, and chest x-ray without evidence of mediastinal widening. Therefore will not pursue further diagnosis at this time.  Will give full dose ASA and 1mg  of PO ativan. Patient placed on telemetry for close monitoring of cardiorespiratory status.   _________________________ 6:49 AM on 09/22/2020 -----------------------------------------  Patient is pain-free after p.o. Ativan and aspirin.  First troponin is negative.  Chest x-ray visualized by me with no acute findings, confirmed by  radiology.  Plan to get a second troponin and if that is negative will discharge patient home.  Care will be transferred to incoming physician at 7 AM      _____________________________________________ Please note:  Patient was evaluated in Emergency Department today for the symptoms described in the history of present illness. Patient was evaluated in the context of the global COVID-19 pandemic, which necessitated consideration that the patient might be at risk for infection with the SARS-CoV-2 virus that causes COVID-19. Institutional protocols and algorithms that pertain to the evaluation of patients at risk for COVID-19 are in a state of rapid change based on information released by regulatory bodies including the CDC and federal and state organizations. These policies and algorithms were followed during the patient's care in the ED.  Some ED evaluations and interventions may be delayed as a result of limited staffing during the pandemic.   [place] Controlled Substance Database was reviewed by me. ____________________________________________   FINAL CLINICAL IMPRESSION(S) / ED DIAGNOSES   Final diagnoses:  Chest pain, unspecified type  Polysubstance abuse (HCC)      NEW MEDICATIONS STARTED DURING THIS VISIT:  ED Discharge Orders    None       Note:  This document was prepared using Dragon voice recognition software and may include unintentional dictation errors.    09/24/2020, Don Perking, MD 09/22/20 [phone]

## 2020-09-22 NOTE — ED Notes (Signed)
Lab contacted to collect trop d/t hard stick
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
## 2020-09-22 NOTE — ED Notes (Signed)
Pt provided sandwich tray and ice water 

## 2020-09-22 NOTE — ED Notes (Signed)
This RN as well as Dorian SRN attempted to straight stick pt two times to collect blood work. Pt insisted on both sticks that needle be removed. Unable to obtain blood at this time. Pt asked staff to wait for po medication to start working prior to attempting to stick again.

## 2020-09-22 NOTE — ED Notes (Signed)
Pt eating at this time.

## 2021-02-09 ENCOUNTER — Encounter: Payer: Self-pay | Admitting: Emergency Medicine

## 2021-02-09 ENCOUNTER — Emergency Department
Admission: EM | Admit: 2021-02-09 | Discharge: 2021-02-09 | Disposition: A | Discharge status: Home / Self Care | Payer: Self-pay | Attending: Emergency Medicine | Admitting: Emergency Medicine

## 2021-02-09 ENCOUNTER — Other Ambulatory Visit: Payer: Self-pay

## 2021-02-09 DIAGNOSIS — Z79899 Other long term (current) drug therapy: Secondary | ICD-10-CM | POA: Insufficient documentation

## 2021-02-09 DIAGNOSIS — M79671 Pain in right foot: Secondary | ICD-10-CM | POA: Insufficient documentation

## 2021-02-09 DIAGNOSIS — F17210 Nicotine dependence, cigarettes, uncomplicated: Secondary | ICD-10-CM | POA: Insufficient documentation

## 2021-02-09 DIAGNOSIS — F23 Brief psychotic disorder: Secondary | ICD-10-CM | POA: Insufficient documentation

## 2021-02-09 DIAGNOSIS — Z5321 Procedure and treatment not carried out due to patient leaving prior to being seen by health care provider: Secondary | ICD-10-CM | POA: Insufficient documentation

## 2021-02-09 DIAGNOSIS — F1994 Other psychoactive substance use, unspecified with psychoactive substance-induced mood disorder: Secondary | ICD-10-CM | POA: Diagnosis present

## 2021-02-09 DIAGNOSIS — F1924 Other psychoactive substance dependence with psychoactive substance-induced mood disorder: Secondary | ICD-10-CM | POA: Insufficient documentation

## 2021-02-09 DIAGNOSIS — M79672 Pain in left foot: Secondary | ICD-10-CM | POA: Insufficient documentation

## 2021-02-09 LAB — URINE DRUG SCREEN, QUALITATIVE (ARMC ONLY)
Amphetamines, Ur Screen: POSITIVE — AB
Barbiturates, Ur Screen: NOT DETECTED
Benzodiazepine, Ur Scrn: NOT DETECTED
Cannabinoid 50 Ng, Ur ~~LOC~~: NOT DETECTED
Cocaine Metabolite,Ur ~~LOC~~: NOT DETECTED
MDMA (Ecstasy)Ur Screen: NOT DETECTED
Methadone Scn, Ur: NOT DETECTED
Opiate, Ur Screen: NOT DETECTED
Phencyclidine (PCP) Ur S: NOT DETECTED
Tricyclic, Ur Screen: NOT DETECTED
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
## 2021-02-09 NOTE — ED Notes (Signed)
Per ED tech, lab to come and stick patient for bloodwork after multiple failed blood draws in triage.
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
## 2021-02-09 NOTE — ED Triage Notes (Signed)
C/O bilateral foot pain x 2 months

## 2021-02-09 NOTE — ED Notes (Signed)
Dinner tray delivered to pt. Pt eating dinner at this time.

## 2021-02-09 NOTE — ED Notes (Signed)
When this nurse was going into to eval the pt he opened the door  States he does not have to take this  So he walked out of room

## 2021-02-09 NOTE — ED Notes (Addendum)
Pt dressed out into hospital attire with this tech. Jonny Ruiz, EDT, and [employer] in the rm. Pt belongings consist of dark grey shoes, tan shorts, black shirt, a pair of white boxers, a pair of black boxers, black belt, red shorts,and  a white colored necklace. Pt calm and cooperative while dressing out. Pt belongings placed into one pt belongings bag and labeled with pt name.

## 2021-02-09 NOTE — ED Notes (Signed)
Given bag of belongings. ambulated to lobby to allow pt to retrieve his other belongings that security was holding unitl discharge. Ate dinner.  Verified correct patient and correct discharge papers given. Pt alert and oriented X 4, stable for discharge. RR even and unlabored, color WNL. Discussed discharge instructions and follow-up as directed. Discharge medications discussed, when prescribed. Pt had opportunity to ask questions, and RN available to provide patient and/or family education.
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
## 2021-02-09 NOTE — ED Notes (Signed)
Dr.Clapacs at bedside  

## 2021-02-09 NOTE — Discharge Instructions (Addendum)
Please follow-up with [facility] as you planned.  Please do not use any drugs anymore.  Please return here if you have any further problems.
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
## 2022-01-31 ENCOUNTER — Emergency Department
Admission: EM | Admit: 2022-01-31 | Discharge: 2022-01-31 | Disposition: A | Discharge status: Home / Self Care | Payer: Self-pay | Attending: Emergency Medicine | Admitting: Emergency Medicine

## 2022-01-31 ENCOUNTER — Other Ambulatory Visit: Payer: Self-pay

## 2022-01-31 ENCOUNTER — Emergency Department: Payer: Self-pay

## 2022-01-31 DIAGNOSIS — R0789 Other chest pain: Secondary | ICD-10-CM | POA: Insufficient documentation

## 2022-01-31 DIAGNOSIS — R079 Chest pain, unspecified: Secondary | ICD-10-CM

## 2022-01-31 DIAGNOSIS — R0602 Shortness of breath: Secondary | ICD-10-CM | POA: Insufficient documentation

## 2022-01-31 NOTE — ED Notes (Signed)
This Clinical research associate and ED tech attempted blood draw.

## 2022-01-31 NOTE — ED Provider Notes (Addendum)
[facility] Provider Note    Event Date/Time   First MD Initiated Contact with Patient 01/31/22 [phone]     (approximate)   History   Chest Pain   HPI  Adam Donovan is a 41 y.o. male with history of substance abuse who presents to the emergency department EMS with chest pain.  States he recently just got out of jail.  Tonight he states he was being followed by the police which made him nervous and he started having chest tightness and shortness of breath.  No fevers, cough, vomiting, diarrhea.  Was reportedly combative with EMS but did not receive any sedation in route.  Here he is calm and cooperative.  He denies SI, HI or hallucinations.  States he has not used any drugs in "years".   History provided by patient and EMS.    Past Medical History:  Diagnosis Date   Alcohol abuse    alcohol withdrawal   IV drug abuse (HCC)     Past Surgical History:  Procedure Laterality Date   APPENDECTOMY      MEDICATIONS:  Prior to Admission medications   Medication Sig Start Date End Date Taking? Authorizing Provider  bacitracin ointment Apply topically 2 (two) times daily. 12/03/18   Burnadette Pop, MD  carbamazepine (TEGRETOL) 200 MG tablet 800mg  PO QD X 1D, then 600mg  PO QD X 1D, then 400mg  QD X 1D, then 200mg  PO QD X 2D 06/15/17   , PA-C  hydrOXYzine (ATARAX/VISTARIL) 25 MG tablet Take 1 tablet (25 mg total) by mouth every 6 (six) hours as needed for itching. 12/08/19   , PA-C    Physical Exam   Triage Vital Signs: ED Triage Vitals  Enc Vitals Group     BP      Pulse      Resp      Temp      Temp src      SpO2      Weight      Height      Head Circumference      Peak Flow      Pain Score      Pain Loc      Pain Edu?      Excl. in GC?     Most recent vital signs: Vitals:   01/31/22 0528  BP: 113/87  Pulse: 99  Resp: 18  Temp: 98.7 F (37.1 C)  SpO2: 96%    CONSTITUTIONAL: Alert and oriented and responds  appropriately to questions. Well-appearing; well-nourished HEAD: Normocephalic, atraumatic EYES: Conjunctivae clear, pupils appear equal, sclera nonicteric ENT: normal nose; moist mucous membranes NECK: Supple, normal ROM CARD: RRR; S1 and S2 appreciated; no murmurs, no clicks, no rubs, no gallops RESP: Normal chest excursion without splinting or tachypnea; breath sounds clear and equal bilaterally; no wheezes, no rhonchi, no rales, no hypoxia or respiratory distress, speaking full sentences ABD/GI: Normal bowel sounds; non-distended; soft, non-tender, no rebound, no guarding, no peritoneal signs BACK: The back appears normal EXT: Normal ROM in all joints; no deformity noted, no edema; no cyanosis SKIN: Normal color for age and race; warm; no rash on exposed skin NEURO: Moves all extremities equally, normal speech, no ambulates with normal gait, no facial asymmetry PSYCH: The patient's mood and manner are appropriate.  No SI or HI.  Not responding to internal stimuli.  Calm and cooperative.   ED Results / Procedures / Treatments   LABS: (all labs ordered are  listed, but only abnormal results are displayed) Labs Reviewed  CBC WITH DIFFERENTIAL/PLATELET  COMPREHENSIVE METABOLIC PANEL  ETHANOL  URINE DRUG SCREEN, QUALITATIVE ([facility] ONLY)  TROPONIN I (HIGH SENSITIVITY)     EKG:  EKG Interpretation  Date/Time:  Tuesday January 31 2022 05:24:08 EDT Ventricular Rate:  96 PR Interval:  148 QRS Duration: 104 QT Interval:  348 QTC Calculation: 439 R Axis:   46 Text Interpretation: Normal sinus rhythm Normal ECG When compared with ECG of 22-Sep-2020 04:24, PREVIOUS ECG IS PRESENT Confirmed by Pryor Curia [phone]) on 01/31/2022 5:27:14 AM         RADIOLOGY: My personal review and interpretation of imaging: Chest x-ray clear.  I have personally reviewed all radiology reports.   DG Chest 2 View  Result Date: 01/31/2022 CLINICAL DATA:  Chest pain and shortness of breath EXAM: CHEST  - 2 VIEW COMPARISON:  09/22/2020 FINDINGS: Normal heart size and mediastinal contours. No acute infiltrate or edema. No effusion or pneumothorax. No acute osseous findings. IMPRESSION: Negative chest. Electronically Signed   By: Jorje Guild M.D.   On: 01/31/2022 06:04     PROCEDURES:  Critical Care performed: No      .1-3 Lead EKG Interpretation  Performed by: Ashlay Altieri, Delice Bison, DO Authorized by: Iya Hamed, Delice Bison, DO     Interpretation: normal     ECG rate:  99   ECG rate assessment: normal     Rhythm: sinus rhythm     Ectopy: none     Conduction: normal       IMPRESSION / MDM / ASSESSMENT AND PLAN / ED COURSE  I reviewed the triage vital signs and the nursing notes.    Patient here with episode of chest pain and shortness of breath.  States he is still not feeling back to normal.  States he got anxious because he felt like the police were following him.  The patient is on the cardiac monitor to evaluate for evidence of arrhythmia and/or significant heart rate changes.   DIFFERENTIAL DIAGNOSIS (includes but not limited to):   Anxiety, musculoskeletal pain, ACS, PE, dissection, pneumonia, pneumothorax   Patient's presentation is most consistent with acute presentation with potential threat to life or bodily function.   PLAN: We will obtain CBC, BMP, troponin.  We will add on ethanol and urine drug screen given reports of being combative with EMS although he is currently calm and cooperative.  EKG nonischemic.  Will obtain chest x-ray.  Patient states that he has court in the morning.  Nurse reports that he had urinated on himself.   MEDICATIONS GIVEN IN ED: Medications - No data to display   ED COURSE: Chest x-ray reviewed and interpreted by myself and the radiologist and shows no acute abnormality.  Delay in getting blood work as patient is a difficult stick due to "ruining my veins from bad decisions years ago."  Labs pending including repeat troponin.  Signed out  to oncoming ED physician at 7 AM.   6:33 AM  Pt no longer wants to stay for blood work.  He wants to leave [place].  He understands risk of leaving without further work-up including but not limited to worsening symptoms, severe and permanent disability, death.  Will provide with PCP follow-up information.  Discussed with patient that he can return at any time if he changes his mind and wants further assessment.  He does not appear intoxicated at this time and appears to have capacity to make decisions for  himself.  CONSULTS: Patient signed out AGAINST MEDICAL ADVICE before receiving any blood work.   OUTSIDE RECORDS REVIEWED: Reviewed patient's last psychiatry note on 02/09/2021 with Dr. Toni Amend.  He has been seen before due to agitated paranoid behavior secondary to substance abuse.       FINAL CLINICAL IMPRESSION(S) / ED DIAGNOSES   Final diagnoses:  Nonspecific chest pain     Rx / DC Orders   ED Discharge Orders     None        Note:  This document was prepared using Dragon voice recognition software and may include unintentional dictation errors.   Lajuana Patchell, Layla Maw, DO 01/31/22 [id number]    Montavis Schubring, Layla Maw, DO 01/31/22 [phone]

## 2022-01-31 NOTE — ED Triage Notes (Signed)
Patient arrived to ED by [facility]. Per EMS patient called 911 for chest pain. Once EMS got patient on truck patient become combative and refusing to cooperate with them. Patient refused protocols for chest pain and told EMS he had used meth.  Per patient he did not use meth but did tell EMS that and that he is having chest pain and is scared he is having a heart attack. Patient was dressed out by this Clinical research associate with fellow RN Eliberto Ivory. Patient was insistent to go to restroom. Patient was given a specimen cup for urine sample and patient used cup to drink water out of.  Patient denies SI/HI/AVH.

## 2022-01-31 NOTE — Discharge Instructions (Addendum)
Steps to find a Primary Care Provider (PCP):  Call [phone] or [phone] to access "[place] [facility] Find a Doctor Service."  2.  You may also go on the [facility] website at [email or web address]  Please return to the emergency department if at any point you change your mind and want further evaluation for your chest pain.  Your EKG and chest x-ray today were normal but we cannot rule out any other life-threatening abnormalities such as a heart attack without blood work.  You have decided to leave AGAINST MEDICAL ADVICE and understand that there are risk with leaving without further work-up including worsening symptoms, permanent and severe disability, death.

## 2022-02-16 ENCOUNTER — Other Ambulatory Visit: Payer: Self-pay

## 2022-02-16 ENCOUNTER — Emergency Department
Admission: EM | Admit: 2022-02-16 | Discharge: 2022-02-16 | Disposition: A | Discharge status: Home / Self Care | Payer: Self-pay | Attending: Emergency Medicine | Admitting: Emergency Medicine

## 2022-02-16 DIAGNOSIS — Z760 Encounter for issue of repeat prescription: Secondary | ICD-10-CM | POA: Insufficient documentation

## 2022-02-16 MED ORDER — BUPRENORPHINE HCL-NALOXONE HCL 8-2 MG SL SUBL: 1.0000 | SUBLINGUAL_TABLET | Freq: Every day | SUBLINGUAL | 0 refills | Status: DC

## 2022-02-16 MED ORDER — BUPRENORPHINE HCL-NALOXONE HCL 8-2 MG SL SUBL: 1.0000 | SUBLINGUAL_TABLET | Freq: Two times a day (BID) | SUBLINGUAL | 0 refills | Status: AC

## 2022-02-16 MED ORDER — BUPRENORPHINE HCL-NALOXONE HCL 8-2 MG SL SUBL
1.0000 | SUBLINGUAL_TABLET | Freq: Every day | SUBLINGUAL | Status: DC
  Administered 2022-02-16: 1 via SUBLINGUAL
  Filled 2022-02-16: qty 1

## 2022-02-16 NOTE — ED Triage Notes (Signed)
Pt coming from home today with request for suboxone refill. Pt was recently released from prison but was not given anything to take with them until they get established.   PT daily dose- 1 strip bid 8mg  each strip.

## 2022-02-16 NOTE — ED Notes (Signed)
Pt seen walking in hall barefoot. Pt redirected to room, security called, security present at this time talking to pt.

## 2022-02-16 NOTE — ED Notes (Signed)
See triage note  Presents requesting a refill of his Suboxone  States last dose was 2 days ago

## 2022-02-16 NOTE — ED Provider Notes (Signed)
[facility] Provider Note    Event Date/Time   First MD Initiated Contact with Patient 02/16/22 1309     (approximate)   History   Chief Complaint Medication Refill   HPI Adam Donovan is a 41 y.o. male, history of polysubstance use, amphetamine use, substance-induced mood disorder, alcohol use, presents emergency department for medication refill.  He states that he has a history of opiate use and has been on Suboxone 1 strip twice daily 8 mg daily while in prison.  He was recently released from prison 2 days ago, but does not have any Suboxone medication to take here.  He states that he is beginning to feel body aches and chills, consistent with his normal opiate withdrawal symptoms.  Denies any other symptoms at this time.  History Limitations: No limitations.        Physical Exam  Triage Vital Signs: ED Triage Vitals  Enc Vitals Group     BP 02/16/22 1243 (!) 139/101     Pulse Rate 02/16/22 1243 (!) 109     Resp 02/16/22 1243 18     Temp 02/16/22 1243 98.6 F (37 C)     Temp Source 02/16/22 1243 Oral     SpO2 02/16/22 1243 99 %     Weight 02/16/22 1243 200 lb (90.7 kg)     Height 02/16/22 1307 5\' 11"  (1.803 m)     Head Circumference --      Peak Flow --      Pain Score 02/16/22 1243 6     Pain Loc --      Pain Edu? --      Excl. in GC? --     Most recent vital signs: Vitals:   02/16/22 1243 02/16/22 1507  BP: (!) 139/101 (!) 130/90  Pulse: (!) 109 99  Resp: 18 18  Temp: 98.6 F (37 C)   SpO2: 99% 98%    General: Awake, NAD.  Skin: Warm, dry. No rashes or lesions.  Eyes: PERRL. Conjunctivae normal.  CV: Good peripheral perfusion.  Resp: Normal effort.  Abd: Soft, non-tender. No distention.  Neuro: At baseline. No gross neurological deficits.  Musculoskeletal: Normal ROM of all extremities.   Focused Exam: Appears fidgety and uncomfortable.  Pacing back-and-forth.  Otherwise friendly.  Physical Exam    ED Results /  Procedures / Treatments  Labs (all labs ordered are listed, but only abnormal results are displayed) Labs Reviewed - No data to display   EKG N/A.   RADIOLOGY  ED Provider Interpretation: N/A.  No results found.  PROCEDURES:  Critical Care performed: N/A.  Procedures    MEDICATIONS ORDERED IN ED: Medications  buprenorphine-naloxone (SUBOXONE) 8-2 mg per SL tablet 1 tablet (1 tablet Sublingual Given 02/16/22 1409)     IMPRESSION / MDM / ASSESSMENT AND PLAN / ED COURSE  I reviewed the triage vital signs and the nursing notes.                              Differential diagnosis includes, but is not limited to, opiate withdrawal, encounter for medication refill.    Assessment/Plan Patient presents for medication refill of Suboxone. He does have a documented history of prior Suboxone prescriptions, as well as a documented history of opiate use disorder.  Provide him with a initial treatment of Suboxone here in the emergency department given his early symptoms of withdrawal and will provide him with  a very short prescription for Suboxone to help him bridge until he can be seen by a regular provider.  Provided him with several resources for substance use clinics.  Advised him that we will NOT be refilling any more Suboxone prescriptions for him here in the emergency department, as that would not be to his benefit.  Advised him that he will need a regular provider for ongoing management.  Patient was amenable to this plan.  Will discharge.  Provided the patient with anticipatory guidance, return precautions, and educational material. Encouraged the patient to return to the emergency department at any time if they begin to experience any new or worsening symptoms. Patient expressed understanding and agreed with the plan.   Patient's presentation is most consistent with acute, uncomplicated illness.       FINAL CLINICAL IMPRESSION(S) / ED DIAGNOSES   Final diagnoses:   Medication refill     Rx / DC Orders   ED Discharge Orders          Ordered    buprenorphine-naloxone (SUBOXONE) 8-2 mg SUBL SL tablet  Daily,   Status:  Discontinued        02/16/22 1449    buprenorphine-naloxone (SUBOXONE) 8-2 mg SUBL SL tablet  2 times daily        02/16/22 1455             Note:  This document was prepared using Dragon voice recognition software and may include unintentional dictation errors.   Varney Daily, [place] 02/16/22 1556    Shaune Pollack, MD 02/21/22 [phone]

## 2022-02-16 NOTE — ED Notes (Signed)
Patient was asked multiple times to return to his room in order to maintain the privacy of the other patient's. Patient stated that he was not concerned about the other patients. Patient was again informed it was for the privacy of the other patient's regardless of if he was concerned about the other patient's or not.

## 2022-02-16 NOTE — Discharge Instructions (Addendum)
-  Please follow-up with a substance use center or pain clinic for future medications.  The emergency department cannot refill any more scripts for suboxone.  -Return to the emergency department anytime if you begin to experience any new or worsening symptoms.

## 2022-03-28 DIAGNOSIS — Z760 Encounter for issue of repeat prescription: Secondary | ICD-10-CM | POA: Diagnosis not present

## 2022-03-28 DIAGNOSIS — G8929 Other chronic pain: Secondary | ICD-10-CM | POA: Diagnosis not present

## 2022-03-28 DIAGNOSIS — M7918 Myalgia, other site: Secondary | ICD-10-CM | POA: Diagnosis not present

## 2022-03-30 DIAGNOSIS — R197 Diarrhea, unspecified: Secondary | ICD-10-CM | POA: Diagnosis not present

## 2022-03-30 DIAGNOSIS — Z760 Encounter for issue of repeat prescription: Secondary | ICD-10-CM | POA: Diagnosis not present

## 2022-05-03 ENCOUNTER — Emergency Department
Admission: EM | Admit: 2022-05-03 | Discharge: 2022-05-03 | Disposition: A | Discharge status: Home / Self Care | Payer: Self-pay | Attending: Emergency Medicine | Admitting: Emergency Medicine

## 2022-05-03 ENCOUNTER — Other Ambulatory Visit: Payer: Self-pay

## 2022-05-03 DIAGNOSIS — Z760 Encounter for issue of repeat prescription: Secondary | ICD-10-CM | POA: Insufficient documentation

## 2022-05-03 DIAGNOSIS — G8929 Other chronic pain: Secondary | ICD-10-CM

## 2022-05-03 MED ORDER — BUPRENORPHINE HCL-NALOXONE HCL 8-2 MG SL SUBL
1.0000 | SUBLINGUAL_TABLET | Freq: Every day | SUBLINGUAL | Status: DC
  Administered 2022-05-03: 1 via SUBLINGUAL
  Filled 2022-05-03: qty 1

## 2022-05-03 NOTE — Discharge Instructions (Addendum)
For list of prescribers please see below: [email or web address]  Please follow-up with the list of prescribers.  As we discussed we would not be able to provide any further doses of Suboxone in the emergency department or prescriptions going forward.  You need to follow-up with a pain management physician.  Return to the emergency department for any emergent condition.

## 2022-05-03 NOTE — ED Provider Notes (Signed)
   [facility] Provider Note    Event Date/Time   First MD Initiated Contact with Patient 05/03/22 1951     (approximate)  History   Chief Complaint: Medication Refill  HPI  Adam Donovan is a 41 y.o. male with a past medical history of substance abuse who presents to the emergency department being out of his Suboxone.  According to the patient since getting out of jail he has not been able to get back in with his Suboxone prescriber.  States he has an appointment but is not for 2 weeks.  Patient came to the emergency department hoping for a refill of his Suboxone.  Patient has no acute complaints.  States his last dose was 2 days ago.  Physical Exam   Triage Vital Signs: ED Triage Vitals  Enc Vitals Group     BP 05/03/22 1812 (!) 138/97     Pulse Rate 05/03/22 1812 (!) 103     Resp 05/03/22 1812 18     Temp 05/03/22 1812 98 F (36.7 C)     Temp src --      SpO2 05/03/22 1812 97 %     Weight --      Height --      Head Circumference --      Peak Flow --      Pain Score 05/03/22 1811 0     Pain Loc --      Pain Edu? --      Excl. in GC? --     Most recent vital signs: Vitals:   05/03/22 1812  BP: (!) 138/97  Pulse: (!) 103  Resp: 18  Temp: 98 F (36.7 C)  SpO2: 97%    General: Awake, no distress.  CV:  Good peripheral perfusion. Resp:  Normal effort.  Abd:  No distention    ED Results / Procedures / Treatments   MEDICATIONS ORDERED IN ED: Medications  buprenorphine-naloxone (SUBOXONE) 8-2 mg per SL tablet 1 tablet (has no administration in time range)     IMPRESSION / MDM / ASSESSMENT AND PLAN / ED COURSE  I reviewed the triage vital signs and the nursing notes.  Patient's presentation is most consistent with acute, uncomplicated illness.  Patient presents emergency department for refill of his Suboxone.  I discussed with the patient that he needs to see his pain management provider or our pain clinic for ongoing Suboxone  medication.  We will dose a one-time dose of Suboxone in the emergency department.  I have provided a list of prescribing practitioners in the area.  However I have also made it very clear to the patient that he cannot return to the emergency department for any further doses of Suboxone and he needs to follow-up with pain management as we are not a chronic pain management center.  Patient understands.  We will discharge home.  FINAL CLINICAL IMPRESSION(S) / ED DIAGNOSES   Suboxone refill  Rx / DC Orders   No refill given, one-time dose given in the emergency department.  Note:  This document was prepared using Dragon voice recognition software and may include unintentional dictation errors.   Minna Antis, MD 05/03/22 2030

## 2022-05-03 NOTE — ED Triage Notes (Signed)
Pt comes with c/o needing his suboxone refilled. Pt states last dose was two days ago. Pt states not using.

## 2022-05-26 ENCOUNTER — Emergency Department
Admission: EM | Admit: 2022-05-26 | Discharge: 2022-05-26 | Disposition: A | Discharge status: Home / Self Care | Payer: 59 | Attending: Emergency Medicine | Admitting: Emergency Medicine

## 2022-05-26 ENCOUNTER — Other Ambulatory Visit: Payer: Self-pay

## 2022-05-26 DIAGNOSIS — F1123 Opioid dependence with withdrawal: Secondary | ICD-10-CM | POA: Diagnosis not present

## 2022-05-26 DIAGNOSIS — Z760 Encounter for issue of repeat prescription: Secondary | ICD-10-CM | POA: Insufficient documentation

## 2022-05-26 HISTORY — DX: Encounter for therapeutic drug level monitoring: Z51.81

## 2022-05-26 HISTORY — DX: Opioid abuse, in remission: F11.11

## 2022-05-26 HISTORY — DX: Long term (current) use of opiate analgesic: Z79.891

## 2022-05-26 MED ORDER — BUPRENORPHINE HCL-NALOXONE HCL 8-2 MG SL SUBL
1.0000 | SUBLINGUAL_TABLET | Freq: Once | SUBLINGUAL | Status: AC
  Administered 2022-05-26: 1 via SUBLINGUAL
  Filled 2022-05-26: qty 1
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
## 2022-05-26 NOTE — ED Notes (Signed)
First Nurse Note: Pt reports congestion and medication refill of suboxone

## 2022-05-26 NOTE — ED Provider Notes (Signed)
[facility] Provider Note    Event Date/Time   First MD Initiated Contact with Patient 05/26/22 [phone]     (approximate)   History   Chief Complaint Medication Refill   HPI  Adam Donovan is a 41 y.o. male with past medical history of opiate use disorder who presents to the ED requesting medication refill.  Patient reports that he was recently in jail and they were potentially going to arrange for him to receive Suboxone, which she has taken in the past.  He states that he was let go from jail too soon for this to occur, had been taking into custody while in [place] where his prior clinic was.  He states he missed his appointment and now does not have a way to get his Suboxone refilled.  He complains of diffuse body aches, shivers, and nausea.  He also reports that his lawyer told him he needs an MRI of his neck due to chronic pain in his neck.     Physical Exam   Triage Vital Signs: ED Triage Vitals [05/26/22 0906]  Enc Vitals Group     BP (!) 132/91     Pulse Rate 97     Resp 16     Temp 97.8 F (36.6 C)     Temp Source Oral     SpO2 100 %     Weight 180 lb (81.6 kg)     Height 5\' 11"  (1.803 m)     Head Circumference      Peak Flow      Pain Score 10     Pain Loc      Pain Edu?      Excl. in GC?     Most recent vital signs: Vitals:   05/26/22 0906  BP: (!) 132/91  Pulse: 97  Resp: 16  Temp: 97.8 F (36.6 C)  SpO2: 100%    Constitutional: Alert and oriented. Eyes: Conjunctivae are normal. Head: Atraumatic. Nose: No congestion/rhinnorhea. Mouth/Throat: Mucous membranes are moist.  Neck: No midline cervical spine tenderness to palpation. Cardiovascular: Normal rate, regular rhythm. Grossly normal heart sounds.  2+ radial pulses bilaterally. Respiratory: Normal respiratory effort.  No retractions. Lungs CTAB. Gastrointestinal: Soft and nontender. No distention. Musculoskeletal: No lower extremity tenderness nor edema.   Neurologic:  Normal speech and language. No gross focal neurologic deficits are appreciated.    ED Results / Procedures / Treatments   Labs (all labs ordered are listed, but only abnormal results are displayed) Labs Reviewed - No data to display   PROCEDURES:  Critical Care performed: No  Procedures   MEDICATIONS ORDERED IN ED: Medications  buprenorphine-naloxone (SUBOXONE) 8-2 mg per SL tablet 1 tablet (1 tablet Sublingual Given 05/26/22 1034)     IMPRESSION / MDM / ASSESSMENT AND PLAN / ED COURSE  I reviewed the triage vital signs and the nursing notes.                              41 y.o. male with past medical history of opiate use disorder who presents to the ED complaining of opiate withdrawal and difficulties refilling his Suboxone.  Patient's presentation is most consistent with acute, uncomplicated illness.  Differential diagnosis includes, but is not limited to, opiate withdrawal, chronic pain syndrome, opiate use disorder.  Patient nontoxic-appearing and in no acute distress, vital signs are unremarkable.  He appears to have mild symptoms of opiate withdrawal at  this time but is well-appearing and tolerating oral intake without difficulty.  He was given dose of Suboxone but explained that we cannot refill this medication from the ED.  He was provided with information for chronic pain follow-up.  He also complains of chronic neck pain with known neurologic findings on exam, MRI is not indicated here from the ED.  He was provided referral to establish care with PCP for outpatient MRI, counseled to return to the ED for new or worsening symptoms.  Patient agrees with plan.      FINAL CLINICAL IMPRESSION(S) / ED DIAGNOSES   Final diagnoses:  Opioid dependence with withdrawal (HCC)     Rx / DC Orders   ED Discharge Orders     None        Note:  This document was prepared using Dragon voice recognition software and may include unintentional dictation  errors.   Chesley Noon, MD 05/26/22 1044

## 2022-05-31 ENCOUNTER — Emergency Department
Admission: EM | Admit: 2022-05-31 | Discharge: 2022-05-31 | Discharge status: Against Medical Advice | Payer: Medicaid Other | Attending: Emergency Medicine | Admitting: Emergency Medicine

## 2022-05-31 ENCOUNTER — Encounter: Payer: Self-pay | Admitting: Emergency Medicine

## 2022-05-31 DIAGNOSIS — R609 Edema, unspecified: Secondary | ICD-10-CM | POA: Diagnosis not present

## 2022-05-31 DIAGNOSIS — R509 Fever, unspecified: Secondary | ICD-10-CM | POA: Diagnosis not present

## 2022-05-31 DIAGNOSIS — Z5321 Procedure and treatment not carried out due to patient leaving prior to being seen by health care provider: Secondary | ICD-10-CM | POA: Insufficient documentation

## 2022-05-31 DIAGNOSIS — M542 Cervicalgia: Secondary | ICD-10-CM | POA: Insufficient documentation

## 2022-05-31 DIAGNOSIS — I10 Essential (primary) hypertension: Secondary | ICD-10-CM | POA: Diagnosis not present

## 2022-05-31 NOTE — ED Triage Notes (Signed)
Pt arrived via [facility] from [place] [employer] where he called due to neck pain that he sts has been going on since neck surgery 5-6 years past. Pt denies injury.

## 2022-05-31 NOTE — ED Notes (Signed)
Pt called with no response 

## 2022-06-08 DIAGNOSIS — Z9889 Other specified postprocedural states: Secondary | ICD-10-CM | POA: Diagnosis not present

## 2022-06-08 DIAGNOSIS — R519 Headache, unspecified: Secondary | ICD-10-CM | POA: Diagnosis not present

## 2022-06-08 DIAGNOSIS — Z760 Encounter for issue of repeat prescription: Secondary | ICD-10-CM | POA: Diagnosis not present

## 2022-06-08 DIAGNOSIS — G8929 Other chronic pain: Secondary | ICD-10-CM | POA: Diagnosis not present

## 2022-06-27 DIAGNOSIS — M7918 Myalgia, other site: Secondary | ICD-10-CM | POA: Diagnosis not present

## 2022-06-27 DIAGNOSIS — Z20822 Contact with and (suspected) exposure to covid-19: Secondary | ICD-10-CM | POA: Diagnosis not present

## 2022-06-27 DIAGNOSIS — M549 Dorsalgia, unspecified: Secondary | ICD-10-CM | POA: Diagnosis not present

## 2022-06-27 DIAGNOSIS — M79605 Pain in left leg: Secondary | ICD-10-CM | POA: Diagnosis not present

## 2022-06-27 DIAGNOSIS — M5442 Lumbago with sciatica, left side: Secondary | ICD-10-CM | POA: Diagnosis not present

## 2022-06-27 DIAGNOSIS — E871 Hypo-osmolality and hyponatremia: Secondary | ICD-10-CM | POA: Diagnosis not present

## 2022-06-27 DIAGNOSIS — M545 Low back pain, unspecified: Secondary | ICD-10-CM | POA: Diagnosis not present

## 2022-06-27 DIAGNOSIS — W1839XA Other fall on same level, initial encounter: Secondary | ICD-10-CM | POA: Diagnosis not present

## 2022-06-27 DIAGNOSIS — Z5900 Homelessness unspecified: Secondary | ICD-10-CM | POA: Diagnosis not present

## 2022-06-27 DIAGNOSIS — R509 Fever, unspecified: Secondary | ICD-10-CM | POA: Diagnosis not present

## 2022-06-27 DIAGNOSIS — R69 Illness, unspecified: Secondary | ICD-10-CM | POA: Diagnosis not present

## 2022-06-28 DIAGNOSIS — W19XXXA Unspecified fall, initial encounter: Secondary | ICD-10-CM | POA: Diagnosis not present

## 2022-06-28 DIAGNOSIS — F1910 Other psychoactive substance abuse, uncomplicated: Secondary | ICD-10-CM | POA: Diagnosis not present

## 2022-06-28 DIAGNOSIS — E871 Hypo-osmolality and hyponatremia: Secondary | ICD-10-CM | POA: Diagnosis not present

## 2022-06-28 DIAGNOSIS — M5136 Other intervertebral disc degeneration, lumbar region: Secondary | ICD-10-CM | POA: Diagnosis not present

## 2022-06-28 DIAGNOSIS — F1520 Other stimulant dependence, uncomplicated: Secondary | ICD-10-CM | POA: Diagnosis not present

## 2022-06-28 DIAGNOSIS — M48061 Spinal stenosis, lumbar region without neurogenic claudication: Secondary | ICD-10-CM | POA: Diagnosis not present

## 2022-06-28 DIAGNOSIS — M546 Pain in thoracic spine: Secondary | ICD-10-CM | POA: Diagnosis not present

## 2022-06-28 DIAGNOSIS — L989 Disorder of the skin and subcutaneous tissue, unspecified: Secondary | ICD-10-CM | POA: Diagnosis not present

## 2022-06-28 DIAGNOSIS — G062 Extradural and subdural abscess, unspecified: Secondary | ICD-10-CM | POA: Diagnosis not present

## 2022-06-28 DIAGNOSIS — R7881 Bacteremia: Secondary | ICD-10-CM | POA: Diagnosis not present

## 2022-06-28 DIAGNOSIS — A4101 Sepsis due to Methicillin susceptible Staphylococcus aureus: Secondary | ICD-10-CM | POA: Diagnosis not present

## 2022-06-28 DIAGNOSIS — B9561 Methicillin susceptible Staphylococcus aureus infection as the cause of diseases classified elsewhere: Secondary | ICD-10-CM | POA: Diagnosis not present

## 2022-06-28 DIAGNOSIS — M549 Dorsalgia, unspecified: Secondary | ICD-10-CM | POA: Diagnosis not present

## 2022-06-28 DIAGNOSIS — A499 Bacterial infection, unspecified: Secondary | ICD-10-CM | POA: Diagnosis not present

## 2022-06-28 DIAGNOSIS — A419 Sepsis, unspecified organism: Secondary | ICD-10-CM | POA: Diagnosis not present

## 2022-06-28 DIAGNOSIS — S129XXA Fracture of neck, unspecified, initial encounter: Secondary | ICD-10-CM | POA: Diagnosis not present

## 2022-06-28 DIAGNOSIS — F1120 Opioid dependence, uncomplicated: Secondary | ICD-10-CM | POA: Diagnosis not present

## 2022-06-28 DIAGNOSIS — R739 Hyperglycemia, unspecified: Secondary | ICD-10-CM | POA: Diagnosis not present

## 2022-06-28 DIAGNOSIS — M5442 Lumbago with sciatica, left side: Secondary | ICD-10-CM | POA: Diagnosis not present

## 2022-06-28 DIAGNOSIS — F17200 Nicotine dependence, unspecified, uncomplicated: Secondary | ICD-10-CM | POA: Diagnosis not present

## 2022-06-28 DIAGNOSIS — F259 Schizoaffective disorder, unspecified: Secondary | ICD-10-CM | POA: Diagnosis not present

## 2022-06-28 DIAGNOSIS — Z0389 Encounter for observation for other suspected diseases and conditions ruled out: Secondary | ICD-10-CM | POA: Diagnosis not present

## 2022-06-28 DIAGNOSIS — Z5900 Homelessness unspecified: Secondary | ICD-10-CM | POA: Diagnosis not present

## 2022-06-28 DIAGNOSIS — Z20822 Contact with and (suspected) exposure to covid-19: Secondary | ICD-10-CM | POA: Diagnosis not present

## 2022-06-28 DIAGNOSIS — M542 Cervicalgia: Secondary | ICD-10-CM | POA: Diagnosis not present

## 2022-06-28 DIAGNOSIS — K838 Other specified diseases of biliary tract: Secondary | ICD-10-CM | POA: Diagnosis not present

## 2022-06-28 DIAGNOSIS — M545 Low back pain, unspecified: Secondary | ICD-10-CM | POA: Diagnosis not present

## 2022-06-28 DIAGNOSIS — R69 Illness, unspecified: Secondary | ICD-10-CM | POA: Diagnosis not present

## 2022-06-28 DIAGNOSIS — K5900 Constipation, unspecified: Secondary | ICD-10-CM | POA: Diagnosis not present

## 2022-07-17 ENCOUNTER — Emergency Department
Admission: EM | Admit: 2022-07-17 | Discharge: 2022-07-17 | Discharge status: Against Medical Advice | Payer: Medicaid Other | Source: Home / Self Care

## 2022-07-17 ENCOUNTER — Other Ambulatory Visit: Payer: Self-pay

## 2022-07-17 ENCOUNTER — Emergency Department
Admission: EM | Admit: 2022-07-17 | Discharge: 2022-07-17 | Disposition: A | Discharge status: Home / Self Care | Payer: Medicaid Other | Attending: Emergency Medicine | Admitting: Emergency Medicine

## 2022-07-17 ENCOUNTER — Encounter: Payer: Self-pay | Admitting: Emergency Medicine

## 2022-07-17 DIAGNOSIS — M549 Dorsalgia, unspecified: Secondary | ICD-10-CM | POA: Diagnosis not present

## 2022-07-17 DIAGNOSIS — M5440 Lumbago with sciatica, unspecified side: Secondary | ICD-10-CM | POA: Insufficient documentation

## 2022-07-17 DIAGNOSIS — Z760 Encounter for issue of repeat prescription: Secondary | ICD-10-CM | POA: Insufficient documentation

## 2022-07-17 DIAGNOSIS — M545 Low back pain, unspecified: Secondary | ICD-10-CM | POA: Diagnosis not present

## 2022-07-17 DIAGNOSIS — R079 Chest pain, unspecified: Secondary | ICD-10-CM | POA: Insufficient documentation

## 2022-07-17 DIAGNOSIS — F1123 Opioid dependence with withdrawal: Secondary | ICD-10-CM | POA: Insufficient documentation

## 2022-07-17 DIAGNOSIS — Z5321 Procedure and treatment not carried out due to patient leaving prior to being seen by health care provider: Secondary | ICD-10-CM | POA: Insufficient documentation

## 2022-07-17 DIAGNOSIS — R5381 Other malaise: Secondary | ICD-10-CM | POA: Diagnosis not present

## 2022-07-17 DIAGNOSIS — G8929 Other chronic pain: Secondary | ICD-10-CM | POA: Diagnosis not present

## 2022-07-17 NOTE — ED Provider Triage Note (Signed)
Emergency Medicine Provider Triage Evaluation Note  Adam Donovan , a 42 y.o. male  was evaluated in triage.  Pt complains of methadone, Suboxone refill  Review of Systems  Positive:  Negative:   Physical Exam  There were no vitals taken for this visit. Gen:   Awake, no distress   Resp:  Normal effort  MSK:   Moves extremities without difficulty  Other:    Medical Decision Making  Medically screening exam initiated at 10:06 AM.  Appropriate orders placed.  Adam Donovan was informed that the remainder of the evaluation will be completed by another provider, this initial triage assessment does not replace that evaluation, and the importance of remaining in the ED until their evaluation is complete.  Patient just seen and discharged by me within the hour, he has checked back in apparently hoping to see a different physician however this time he also complains of chest discomfort.  We recommended EKG and labs however patient declined and left.  He has decisional capacity   Lavonia Drafts, MD 07/17/22 1007

## 2022-07-17 NOTE — ED Notes (Signed)
This RN attempting to triage pt. Pt reports here for cp and needs suboxone and methadone, reports was just seen a few minutes ago and MD would not give med. This RN informed pt it is policy that those medications will not be prescribed. Pt states "well I know what I need".  This RN asked pt if wanted to be seen still, pt stated "does it look like I need to be seen bitch?". This RN once again asked if pt wants to be seen, pt started cursing this RN. Informed pt he could leave or be seen, up to pt. Pt told this RN "shut the fuck up, ill slap the shit out of you, you're ugly ass face" Security called.  Pt walked out, steady gait, NAD noted.  First nurse notified.

## 2022-07-17 NOTE — ED Triage Notes (Signed)
Presents via ems from home with lower back pain  States he has been out of his methadone for 2 days  Usually takes 80 mg daily

## 2022-07-17 NOTE — ED Provider Notes (Signed)
   [facility] Provider Note    Event Date/Time   First MD Initiated Contact with Patient 07/17/22 [phone]     (approximate)   History   Back Pain   HPI  Adam Donovan is a 42 y.o. male with a history of chronic back pain, polysubstance abuse who reports that he was recently discharged from [place], I verified this via record review and he was prescribed methadone and he had an appointment on Monday to have a refill of his methadone but missed it for reasons that are unclear.  He states that he is having back pain and feels shaky and thinks that he is in withdrawal.  Review of record demonstrates patient has prior history of similar presentations     Physical Exam   Triage Vital Signs: ED Triage Vitals  Enc Vitals Group     BP 07/17/22 0923 131/76     Pulse Rate 07/17/22 0923 80     Resp 07/17/22 0920 16     Temp 07/17/22 0920 98 F (36.7 C)     Temp Source 07/17/22 0920 Oral     SpO2 07/17/22 0920 100 %     Weight 07/17/22 0914 81.6 kg (180 lb)     Height 07/17/22 0914 1.803 m (5\' 11" )     Head Circumference --      Peak Flow --      Pain Score 07/17/22 0914 10     Pain Loc --      Pain Edu? --      Excl. in [place]? --     Most recent vital signs: Vitals:   07/17/22 0920 07/17/22 0923  BP:  131/76  Pulse:  80  Resp: 16   Temp: 98 F (36.7 C)   SpO2: 100%      General: Awake, no distress.  Well-appearing overall V:  Good peripheral perfusion.  Resp:  Normal effort.  Abd:  No distention.  Other:  Ambulating well   ED Results / Procedures / Treatments   Labs (all labs ordered are listed, but only abnormal results are displayed) Labs Reviewed - No data to display   EKG     RADIOLOGY     PROCEDURES:  Critical Care performed:   Procedures   MEDICATIONS ORDERED IN ED: Medications - No data to display   IMPRESSION / MDM / [place] / ED COURSE  I reviewed the triage vital signs and the nursing  notes. Patient's presentation is most consistent with exacerbation of chronic illness.  Patient here requesting methadone or Suboxone.  I informed him that we do not refill these medications and will not provide them to him.  Offered nonopioid pain medications but patient declined and patient became very upset at this and angry with me and staff and decided to leave          FINAL CLINICAL IMPRESSION(S) / ED DIAGNOSES   Final diagnoses:  Chronic low back pain with sciatica, sciatica laterality unspecified, unspecified back pain laterality  Opioid dependence with withdrawal ([place])     Rx / DC Orders   ED Discharge Orders     None        Note:  This document was prepared using Dragon voice recognition software and may include unintentional dictation errors.   Lavonia Drafts, MD 07/17/22 1003

## 2024-04-29 ENCOUNTER — Ambulatory Visit: Payer: Self-pay

## 2024-04-29 NOTE — Telephone Encounter (Signed)
 FYI Only or Action Required?: FYI only for provider: appointment scheduled on 07/07/24.  Patient was last seen in primary care on Not yet established.  Called Nurse Triage reporting Anxiety.  Symptoms began several months ago.  Interventions attempted: Nothing.  Symptoms are: stable.  Triage Disposition: See PCP Within 2 Weeks  Patient/caregiver understands and will follow disposition?: Yes Reason for Disposition  [1] Symptoms of anxiety or panic attack AND [2] is a chronic symptom (recurrent or ongoing AND present > 4 weeks)  Answer Assessment - Initial Assessment Questions Patient's mom Adam Donovan calling in today. Patient is looking to establish care with Dr. Bernardo at [facility]. Next appointment 07/07/24, added to waitlist for anything sooner. Given info for [facility].   1. CONCERN: Did anything happen that prompted you to call today?      Patient's mom calling to establish care, patient is not feeling well, not eating  2. ANXIETY SYMPTOMS: Can you describe how you (your loved one; patient) have been feeling? (e.g., tense, restless, panicky, anxious, keyed up, overwhelmed, sense of impending doom).      Overwhelmed, panicky, restless  3. ONSET: How long have you been feeling this way? (e.g., hours, days, weeks)     A month or so  4. SEVERITY: How would you rate the level of anxiety? (e.g., 0 - 10; or mild, moderate, severe).     Moderate  5. FUNCTIONAL IMPAIRMENT: How have these feelings affected your ability to do daily activities? Have you had more difficulty than usual doing your normal daily activities? (e.g., getting better, same, worse; self-care, school, work, interactions)     Yes  6. HISTORY: Have you felt this way before? Have you ever been diagnosed with an anxiety problem in the past? (e.g., generalized anxiety disorder, panic attacks, PTSD). If Yes, ask: How was this problem treated? (e.g., medicines,  counseling, etc.)     A little bit before  7. RISK OF HARM - SUICIDAL IDEATION: Do you ever have thoughts of hurting or killing yourself? If Yes, ask:  Do you have these feelings now? Do you have a plan on how you would do this?     Denies  8. TREATMENT:  What has been done so far to treat this anxiety? (e.g., medicines, relaxation strategies). What has helped?     Nothing  9. THERAPIST: Do you have a counselor or therapist? If Yes, ask: What is their name?     Not that mom is aware of  10. PATIENT SUPPORT: Who is with you now? Who do you live with? Do you have family or friends who you can talk to?        Mom Adam Donovan and dad  40. OTHER SYMPTOMS: Do you have any other symptoms? (e.g., feeling depressed, trouble concentrating, trouble sleeping, trouble breathing, palpitations or fast heartbeat, chest pain, sweating, nausea, or diarrhea)       Denies  Protocols used: Anxiety and Panic Attack-A-AH  Copied from CRM #[id number]. Topic: Clinical - Red Word Triage >> Apr 29, 2024  9:38 AM Dedra NOVAK wrote: [facility] that prompted transfer to Nurse Triage: Pt mom, Adam Donovan, said pt is experiencing anxiety, not eating, and just not feeling well. He would like to establish with Dr. Bernardo at [facility]. Warm transfer to NT.

## 2024-05-13 ENCOUNTER — Other Ambulatory Visit: Payer: Self-pay

## 2024-05-13 ENCOUNTER — Emergency Department (HOSPITAL_COMMUNITY): Payer: Self-pay

## 2024-05-13 ENCOUNTER — Encounter (HOSPITAL_COMMUNITY): Payer: Self-pay | Admitting: *Deleted

## 2024-05-13 ENCOUNTER — Emergency Department (HOSPITAL_COMMUNITY)
Admission: EM | Admit: 2024-05-13 | Discharge: 2024-05-13 | Disposition: A | Discharge status: Home / Self Care | Payer: Self-pay | Attending: Emergency Medicine | Admitting: Emergency Medicine

## 2024-05-13 DIAGNOSIS — F22 Delusional disorders: Secondary | ICD-10-CM

## 2024-05-13 NOTE — ED Triage Notes (Signed)
 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  Pt denies any thoughts of harming himself or anyone else.  Pt states that this is ongoing for 30 years.  Pt has been seen by PA

## 2024-05-13 NOTE — ED Provider Notes (Signed)
 [place] [facility]MENT AT [facility] Provider Note   CSN: [id number] Arrival date & time: 05/13/24  1357     Patient presents with: Psychiatric Evaluation   Adam Donovan is a 43 y.o. male.   43 yo M with a chief complaints of people out to get him.  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  He says been going on for years.  He would like to have the implant removed.  He would like to stay in the hospital until its taken out.  He denies suicidal or homicidal ideation.  Denies hallucinations.  He denies cough congestion or fever.  Denies back pain.  Denies chest pain denies abdominal pain.        Prior to Admission medications   Medication Sig Start Date End Date Taking? Authorizing Provider  bacitracin  ointment Apply topically 2 (two) times daily. 12/03/18   Jillian Buttery, MD  carbamazepine  (TEGRETOL ) 200 MG tablet 800mg  PO QD X 1D, then 600mg  PO QD X 1D, then 400mg  QD X 1D, then 200mg  PO QD X 2D 06/15/17   Tran, Bowie, PA-C  hydrOXYzine  (ATARAX /VISTARIL ) 25 MG tablet Take 1 tablet (25 mg total) by mouth every 6 (six) hours as needed for itching. 12/08/19   Saunders Shona LITTIE, PA-C  linezolid (ZYVOX) 600 MG tablet Take 600 mg by mouth 2 (two) times daily.    [provider]  risperiDONE (RISPERDAL) 2 MG tablet Take 2 mg by mouth at bedtime.    [provider]    Allergies: Patient has no known allergies.    Review of Systems  Updated Vital Signs BP 124/73   Pulse 75   Temp 97.9 F (36.6 C) (Oral)   Resp 15   SpO2 100%   Physical Exam Vitals and nursing note reviewed.  Constitutional:      Appearance: He is well-developed.  HENT:     Head: Normocephalic and atraumatic.  Eyes:     Pupils: Pupils are equal, round, and reactive to light.  Neck:     Vascular: No JVD.  Cardiovascular:     Rate and Rhythm: Normal rate and regular rhythm.     Heart sounds: No murmur heard.     No friction rub. No gallop.  Pulmonary:     Effort: No respiratory distress.     Breath sounds: No wheezing.  Abdominal:     General: There is no distension.     Tenderness: There is no abdominal tenderness. There is no guarding or rebound.  Musculoskeletal:        General: Normal range of motion.     Cervical back: Normal range of motion and neck supple.  Skin:    Coloration: Skin is not pale.     Findings: No rash.  Neurological:     Mental Status: He is alert and oriented to person, place, and time.     GCS: GCS eye subscore is 4. GCS verbal subscore is 5. GCS motor subscore is 6.     Cranial Nerves: Cranial nerves 2-12 are intact.     Sensory: Sensation is intact.     Motor: Motor function is intact.     Coordination: Coordination is intact.     Comments: Benign neuro exam no obvious surgical scars to the skull  Psychiatric:        Behavior: Behavior normal.     (all labs ordered are  listed, but only abnormal results are displayed) Labs Reviewed - No data to display  EKG: None  Radiology: CT Cervical Spine Wo Contrast Result Date: 05/13/2024 EXAM: CT CERVICAL SPINE WITHOUT CONTRAST 05/13/2024 04:05:21 PM TECHNIQUE: CT of the cervical spine was performed without the administration of intravenous contrast. Multiplanar reformatted images are provided for review. Automated exposure control, iterative reconstruction, and/or weight based adjustment of the mA/kV was utilized to reduce the radiation dose to as low as reasonably achievable. COMPARISON: Cervical spine CT and MRI 03/18/2019. CLINICAL HISTORY: Neck pain, acute, no red flags. 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 Interval healing of bilateral C2 fractures. Interval C2-C3 ACDF with solid arthrodesis. Bilateral C2-C3 facet ankylosis as well. No acute fracture or suspicious lesion. DEGENERATIVE CHANGES: Mild spondylosis at C3-C4. No evidence of high grade stenosis. SOFT  TISSUES: No prevertebral soft tissue swelling. IMPRESSION: 1. No acute osseous abnormality in the cervical spine. 2. Interval healing of bilateral C2 fractures. Interval C2-3 ACDF with solid arthrodesis. Electronically signed by: Dasie Hamburg MD 05/13/2024 04:29 PM EST RP Workstation: [id number]   CT Head Wo Contrast Result Date: 05/13/2024 EXAM: CT HEAD WITHOUT 05/13/2024 04:05:21 PM TECHNIQUE: CT of the head was performed without the administration of intravenous contrast. Automated exposure control, iterative reconstruction, and/or weight based adjustment of the mA/kV was utilized to reduce the radiation dose to as low as reasonably achievable. COMPARISON: Head CT 01/19/2019 CLINICAL HISTORY: Mental status change, unknown cause FINDINGS: BRAIN AND VENTRICLES: There is no evidence of an acute infarct, intracranial hemorrhage, mass, midline shift, hydrocephalus, or extra-axial fluid collection. Cerebral volume is normal. No metallic foreign body is present. ORBITS: No acute abnormality. SINUSES AND MASTOIDS: Minimal mucosal thickening in the included portions of the paranasal sinuses. Clear mastoid air cells. SOFT TISSUES AND SKULL: No acute skull fracture. No acute soft tissue abnormality. IMPRESSION: 1. Negative head CT. Electronically signed by: Dasie Hamburg MD 05/13/2024 04:25 PM EST RP Workstation: [id number]     Procedures   Medications Ordered in the ED - No data to display                                  Medical Decision Making  43 yo M with a cc of wanting a brain implant removed.  Patient tells me that it has been in since birth and he is worried that people are tracking him and his life is in danger.  He denies SI/HI.  He denies hallucinations.    Has a benign neuro exam here.   CT head and C spine are negative.   I do not feel that the patient is an imminent danger to himself or others.  Do not feel he benefit from emergent psychiatric consult.  I gave him information to follow-up  with the [facility] if he so chooses.  9:30 PM:  I have discussed the diagnosis/risks/treatment options with the patient.  Evaluation and diagnostic testing in the emergency department does not suggest an emergent condition requiring admission or immediate intervention beyond what has been performed at this time.  They will follow up with PCP, [facility], Neurosurgery. We also discussed returning to the ED immediately if new or worsening sx occur. We discussed the sx which are most concerning (e.g., sudden worsening pain, fever, inability to tolerate by mouth) that necessitate immediate return. Medications administered to the patient during their visit and  any new prescriptions provided to the patient are listed below.  Medications given during this visit Medications - No data to display   The patient appears reasonably screen and/or stabilized for discharge and I doubt any other medical condition or other [facility] requiring further screening, evaluation, or treatment in the ED at this time prior to discharge.       Final diagnoses:  Paranoia [facility])    ED Discharge Orders     None          Emil Share, DO 05/13/24 2130

## 2024-05-13 NOTE — ED Triage Notes (Signed)
 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

## 2024-05-13 NOTE — ED Provider Triage Note (Signed)
 Emergency Medicine Provider Triage Evaluation Note  Adam Donovan , a 43 y.o. male  was evaluated in triage.  Pt complains of something controlling his brain and believes there is a chip in his spine controlling him. Declines psych eval because he has already had it and is not suicidal, homicidal or avh. Accepts a ct scan and understands that it should show metallic object.  Review of Systems  Positive: Delusion  Negative: fever  Physical Exam  BP 133/88 (BP Location: Right Arm)   Pulse 94   Temp 98.3 F (36.8 C) (Oral)   Resp 20   SpO2 98%  Gen:   Awake, no distress   Resp:  Normal effort  MSK:   Moves extremities without difficulty  Other:    Medical Decision Making  Medically screening exam initiated at 2:43 PM.  Appropriate orders placed.  Adam Donovan was informed that the remainder of the evaluation will be completed by another provider, this initial triage assessment does not replace that evaluation, and the importance of remaining in the ED until their evaluation is complete.     Arloa Chroman, PA-C 05/13/24 1456

## 2024-05-13 NOTE — Discharge Instructions (Signed)
 Please follow-up with your family doctor in the office.  I did give you information to follow-up with the neurosurgery clinic if you would like them to evaluate you.  If you are worried that you are seeing things that other people cannot see ear or hearing things that other people can hear perhaps seeing a mental health provider could help.  There is a 24-hour mental health clinic in [place] that can see you.

## 2024-06-09 ENCOUNTER — Other Ambulatory Visit: Payer: Self-pay

## 2024-06-09 ENCOUNTER — Emergency Department
Admission: EM | Admit: 2024-06-09 | Discharge: 2024-06-09 | Disposition: A | Discharge status: Home / Self Care | Attending: Emergency Medicine | Admitting: Emergency Medicine

## 2024-06-09 DIAGNOSIS — I10 Essential (primary) hypertension: Secondary | ICD-10-CM | POA: Insufficient documentation

## 2024-06-09 DIAGNOSIS — Z760 Encounter for issue of repeat prescription: Secondary | ICD-10-CM | POA: Diagnosis present

## 2024-06-09 MED ORDER — BUPRENORPHINE HCL 8 MG SL SUBL: 8.0000 mg | SUBLINGUAL_TABLET | Freq: Every day | SUBLINGUAL | 0 refills | Status: AC

## 2024-06-09 NOTE — ED Provider Notes (Signed)
 "  [facility] Provider Note    Event Date/Time   First MD Initiated Contact with Patient 06/09/24 1508     (approximate)   History   Medication Refill    HPI  Adam Donovan is a 44 y.o. male    with a past medical history of paranoia, chronic low back pain with sciatica, sepsis, opioid use disorder, chest pain, who presents to the ED asking for medication refill.. According to the patient, he is taking Suboxone  but getting bad side effects consistent in headache, and damaging his teeth.  Patient was seen today [facility], he can have a PCP appointment in a week to change the medication.  Patient decided to come here.  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   Cocaine abuse (HCC) 07/15/2020   C2 cervical fracture (HCC) 12/02/2018   Abnormal liver function 12/02/2018   Hypokalemia 12/02/2018   Abrasions of multiple sites      Physical Exam   Triage Vital Signs: ED Triage Vitals  Encounter Vitals Group     BP 06/09/24 1436 (!) 142/95     Girls Systolic BP Percentile --      Girls Diastolic BP Percentile --      Boys Systolic BP Percentile --      Boys Diastolic BP Percentile --      Pulse Rate 06/09/24 1436 90     Resp 06/09/24 1436 18     Temp 06/09/24 1436 98 F (36.7 C)     Temp src --      SpO2 06/09/24 1436 100 %     Weight 06/09/24 1435 177 lb (80.3 kg)     Height 06/09/24 1435 5' 11 (1.803 m)     Head Circumference --      Peak Flow --      Pain Score 06/09/24 1435 0     Pain Loc --      Pain Education --      Exclude from Growth Chart --     Most recent vital signs: Vitals:   06/09/24 1436  BP: (!) 142/95  Pulse: 90  Resp: 18   Temp: 98 F (36.7 C)  SpO2: 100%     Physical Exam Vitals and nursing note reviewed.  In triage patient was hypertensive  General:          Awake, no distress.  CV:                  Good peripheral perfusion. Regular rate and rhythm. Resp:               Normal effort. no tachypnea.Equal breath sounds bilaterally.  Abd:                 No distention.  Soft nontender Other:               ED Results / Procedures / Treatments   Labs (all labs ordered are listed, but only abnormal results are displayed) Labs Reviewed - No data to display  PROCEDURES:  Critical Care performed:   Procedures   MEDICATIONS ORDERED IN ED: Medications - No data to display  IMPRESSION / MDM / ASSESSMENT AND PLAN / ED COURSE  I reviewed the triage vital signs and the nursing notes.  Differential diagnosis includes, but is not limited to, medication refill, medication side effects, withdrawal.  Patient's presentation is most consistent with acute, uncomplicated illness.   Adam Donovan is a 44 y.o., male presents today asking for buprenorphine  refill.  See HPI for further information.  During triage patient was hypertensive.  Physical exam within normal limits. Patient's diagnosis is consistent with medication refill.  I did review the patient's allergies and medications.The patient is in stable and satisfactory condition for discharge home  Patient will be discharged home with prescriptions for buprenorphine  8 mg 1 daily. Patient is to follow up with PCP as needed or otherwise directed. Patient is given ED precautions to return to the ED for any worsening or new symptoms. Discussed plan of care with patient, answered all of patient's questions, patient agreeable to plan of care. Advised patient to take medications according to the instructions on the label. Discussed possible side effects of new medications. Patient verbalized understanding.  FINAL CLINICAL IMPRESSION(S) / ED DIAGNOSES   Final  diagnoses:  Medication refill     Rx / DC Orders   ED Discharge Orders          Ordered    buprenorphine  (SUBUTEX ) 8 MG SUBL SL tablet  Daily        06/09/24 1601             Note:  This document was prepared using Dragon voice recognition software and may include unintentional dictation errors.   Janit Kast, PA-C 06/09/24 8397    Willo Dunnings, MD 06/09/24 2326  "

## 2024-06-09 NOTE — Discharge Instructions (Addendum)
 You have been diagnosed with medication refill.  Please take the medication every 8 hours.  Please make an appointment for a follow-up with your PCP.  If you have new symptoms or symptoms worsen please come back to ED or go to your PCP.  Please avoid taking so takes with alcohol.  Please drink plenty of fluids.

## 2024-06-09 NOTE — ED Triage Notes (Signed)
 Pt comes with needing medication switched from suboxone . Pt states he thinks he is having reaction to it.  Pt states his doc told him to come here.

## 2024-07-07 ENCOUNTER — Ambulatory Visit: Admitting: Internal Medicine
